# Patient Record
Sex: Male | Born: 1955
Health system: Southern US, Community
[De-identification: ages and names within clinical notes are randomized; demographics above are authoritative.]

## PROBLEM LIST (undated history)

## (undated) DIAGNOSIS — I2510 Atherosclerotic heart disease of native coronary artery without angina pectoris: Secondary | ICD-10-CM

## (undated) DIAGNOSIS — I10 Essential (primary) hypertension: Secondary | ICD-10-CM

## (undated) DIAGNOSIS — K219 Gastro-esophageal reflux disease without esophagitis: Secondary | ICD-10-CM

## (undated) DIAGNOSIS — E039 Hypothyroidism, unspecified: Secondary | ICD-10-CM

## (undated) HISTORY — PX: ABDOMINAL SURGERY: SHX537

## (undated) HISTORY — PX: FRACTURE SURGERY: SHX138

---

## 2002-10-01 ENCOUNTER — Encounter: Payer: Self-pay | Admitting: Family Medicine

## 2002-10-01 ENCOUNTER — Encounter: Admission: RE | Admit: 2002-10-01 | Discharge: 2002-10-01 | Payer: Self-pay | Admitting: Family Medicine

## 2015-05-11 ENCOUNTER — Other Ambulatory Visit: Payer: Self-pay

## 2015-05-11 ENCOUNTER — Ambulatory Visit (INDEPENDENT_AMBULATORY_CARE_PROVIDER_SITE_OTHER): Payer: BLUE CROSS/BLUE SHIELD | Admitting: Podiatry

## 2015-05-11 ENCOUNTER — Encounter: Payer: Self-pay | Admitting: Podiatry

## 2015-05-11 VITALS — BP 151/79 | HR 58 | Ht 66.0 in | Wt 173.0 lb

## 2015-05-11 DIAGNOSIS — M216X1 Other acquired deformities of right foot: Secondary | ICD-10-CM

## 2015-05-11 DIAGNOSIS — M7742 Metatarsalgia, left foot: Principal | ICD-10-CM | POA: Insufficient documentation

## 2015-05-11 DIAGNOSIS — M7740 Metatarsalgia, unspecified foot: Secondary | ICD-10-CM | POA: Diagnosis not present

## 2015-05-11 DIAGNOSIS — M21969 Unspecified acquired deformity of unspecified lower leg: Secondary | ICD-10-CM | POA: Diagnosis not present

## 2015-05-11 DIAGNOSIS — M216X9 Other acquired deformities of unspecified foot: Secondary | ICD-10-CM

## 2015-05-11 DIAGNOSIS — M7741 Metatarsalgia, right foot: Secondary | ICD-10-CM

## 2015-05-11 NOTE — Patient Instructions (Signed)
Seen for painful feet. Noted of tight Achilles tendon right. Need stretch exercise. Both feet casted for orthotics.

## 2015-05-11 NOTE — Progress Notes (Signed)
SUBJECTIVE: 60 y.o. year old male presents complaining of pain in both feet R>L.  Stated that he works at [employer] on feet 8-10 hours a day for the past 10 years.  Been wearing brand name shoes and insoles for a year. Feet were doing well till 2 months ago. Pain is mostly plantar lateral 1/2 of both feet R>L duration of 2 months.  REVIEW OF SYSTEMS: Hypertension and Thyroid problem that is controlled with medication.   OBJECTIVE: DERMATOLOGIC EXAMINATION: No abnormal skin lesions.  VASCULAR EXAMINATION OF LOWER LIMBS: Pedal pulses: All pedal pulses are palpable with normal pulsation.  Capillary Filling times within 3 seconds in all digits.  Temperature gradient from tibial crest to dorsum of foot is within normal bilateral.  NEUROLOGIC EXAMINATION OF THE LOWER LIMBS: All epicritic and tactile sensations grossly intact.   MUSCULOSKELETAL EXAMINATION: Positive for tight Achilles tendon on right. Positive for excess sagittal plane motion of the first ray bilateral.  Positive for forefoot varus with STJ placed in neutral position.   RADIOGRAPHIC STUDIES:  General overview: AP View:  Adducted metatarsal bones bilateral. Lateral view:  Supinated foot with elevated first ray bilateral. Positive of plantar calcaneal spur bilateral.  ASSESSMENT: 1. Lesser metatarsalgia bilateral R>L. 2. Ankle equinus right. 3. Forefoot varus bilateral. 4. Hypermobile first ray bilateral. 5. Compensatory STJ hyperpronation bilateral.  PLAN: Reviewed clinical findings and available treatment options. Reviewed proper shoe gear. Both feet casted for custom orthotics. Stretch exercise for tight Achilles tendon on right reviewed.

## 2019-07-28 ENCOUNTER — Inpatient Hospital Stay (HOSPITAL_BASED_OUTPATIENT_CLINIC_OR_DEPARTMENT_OTHER)
Admission: EM | Admit: 2019-07-28 | Discharge: 2019-07-30 | DRG: 247 | Disposition: A | Discharge status: Home / Self Care | Payer: BC Managed Care – PPO | Attending: Cardiology | Admitting: Cardiology

## 2019-07-28 ENCOUNTER — Emergency Department (HOSPITAL_BASED_OUTPATIENT_CLINIC_OR_DEPARTMENT_OTHER): Payer: BC Managed Care – PPO

## 2019-07-28 ENCOUNTER — Other Ambulatory Visit: Payer: Self-pay

## 2019-07-28 ENCOUNTER — Encounter (HOSPITAL_BASED_OUTPATIENT_CLINIC_OR_DEPARTMENT_OTHER): Payer: Self-pay | Admitting: Emergency Medicine

## 2019-07-28 DIAGNOSIS — M7741 Metatarsalgia, right foot: Secondary | ICD-10-CM | POA: Diagnosis present

## 2019-07-28 DIAGNOSIS — I214 Non-ST elevation (NSTEMI) myocardial infarction: Principal | ICD-10-CM | POA: Diagnosis present

## 2019-07-28 DIAGNOSIS — Z79890 Hormone replacement therapy (postmenopausal): Secondary | ICD-10-CM | POA: Diagnosis not present

## 2019-07-28 DIAGNOSIS — I10 Essential (primary) hypertension: Secondary | ICD-10-CM | POA: Diagnosis present

## 2019-07-28 DIAGNOSIS — M7742 Metatarsalgia, left foot: Secondary | ICD-10-CM | POA: Diagnosis present

## 2019-07-28 DIAGNOSIS — I200 Unstable angina: Secondary | ICD-10-CM

## 2019-07-28 DIAGNOSIS — E785 Hyperlipidemia, unspecified: Secondary | ICD-10-CM

## 2019-07-28 DIAGNOSIS — Z20822 Contact with and (suspected) exposure to covid-19: Secondary | ICD-10-CM | POA: Diagnosis present

## 2019-07-28 DIAGNOSIS — Z955 Presence of coronary angioplasty implant and graft: Secondary | ICD-10-CM

## 2019-07-28 DIAGNOSIS — N179 Acute kidney failure, unspecified: Secondary | ICD-10-CM | POA: Diagnosis not present

## 2019-07-28 DIAGNOSIS — Z79899 Other long term (current) drug therapy: Secondary | ICD-10-CM

## 2019-07-28 DIAGNOSIS — E039 Hypothyroidism, unspecified: Secondary | ICD-10-CM | POA: Diagnosis present

## 2019-07-28 DIAGNOSIS — K219 Gastro-esophageal reflux disease without esophagitis: Secondary | ICD-10-CM | POA: Diagnosis present

## 2019-07-28 DIAGNOSIS — I2510 Atherosclerotic heart disease of native coronary artery without angina pectoris: Secondary | ICD-10-CM

## 2019-07-28 HISTORY — DX: Gastro-esophageal reflux disease without esophagitis: K21.9

## 2019-07-28 HISTORY — DX: Hypothyroidism, unspecified: E03.9

## 2019-07-28 HISTORY — DX: Essential (primary) hypertension: I10

## 2019-07-28 HISTORY — DX: Atherosclerotic heart disease of native coronary artery without angina pectoris: I25.10

## 2019-07-28 LAB — BASIC METABOLIC PANEL
Anion gap: 13 (ref 5–15)
BUN: 26 mg/dL — ABNORMAL HIGH (ref 8–23)
CO2: 24 mmol/L (ref 22–32)
Calcium: 9 mg/dL (ref 8.9–10.3)
Chloride: 101 mmol/L (ref 98–111)
Creatinine, Ser: 1.67 mg/dL — ABNORMAL HIGH (ref 0.61–1.24)
GFR calc Af Amer: 50 mL/min — ABNORMAL LOW (ref >60)
GFR calc non Af Amer: 44 mL/min — ABNORMAL LOW (ref >60)
Glucose, Bld: 113 mg/dL — ABNORMAL HIGH (ref 70–99)
Potassium: 3.6 mmol/L (ref 3.5–5.1)
Sodium: 138 mmol/L (ref 135–145)

## 2019-07-28 LAB — CBC
HCT: 41.8 % (ref 39.0–52.0)
Hemoglobin: 14.7 g/dL (ref 13.0–17.0)
MCH: 30.2 pg (ref 26.0–34.0)
MCHC: 35.2 g/dL (ref 30.0–36.0)
MCV: 85.8 fL (ref 80.0–100.0)
Platelets: 263 10*3/uL (ref 150–400)
RBC: 4.87 MIL/uL (ref 4.22–5.81)
RDW: 13.3 % (ref 11.5–15.5)
WBC: 11 10*3/uL — ABNORMAL HIGH (ref 4.0–10.5)
nRBC: 0 % (ref 0.0–0.2)

## 2019-07-28 LAB — TROPONIN I (HIGH SENSITIVITY)
Troponin I (High Sensitivity): 59 ng/L — ABNORMAL HIGH (ref <18)
Troponin I (High Sensitivity): 131 ng/L (ref <18)
Troponin I (High Sensitivity): 449 ng/L (ref <18)
Troponin I (High Sensitivity): 859 ng/L (ref <18)

## 2019-07-28 LAB — SARS CORONAVIRUS 2 BY RT PCR (HOSPITAL ORDER, PERFORMED IN ~~LOC~~ HOSPITAL LAB): SARS Coronavirus 2: NEGATIVE

## 2019-07-28 LAB — HEPARIN LEVEL (UNFRACTIONATED): Heparin Unfractionated: 0.41 IU/mL (ref 0.30–0.70)

## 2019-07-28 MED ORDER — ASPIRIN EC 81 MG PO TBEC
81.0000 mg | DELAYED_RELEASE_TABLET | Freq: Every day | ORAL | Status: DC
  Administered 2019-07-30: 81 mg via ORAL
  Filled 2019-07-28: qty 1

## 2019-07-28 MED ORDER — LISINOPRIL 20 MG PO TABS
20.0000 mg | ORAL_TABLET | Freq: Every day | ORAL | Status: DC
  Administered 2019-07-29 – 2019-07-30 (×2): 20 mg via ORAL
  Filled 2019-07-28 (×2): qty 1

## 2019-07-28 MED ORDER — AMLODIPINE BESYLATE 10 MG PO TABS
10.0000 mg | ORAL_TABLET | Freq: Every day | ORAL | Status: DC
  Administered 2019-07-29 – 2019-07-30 (×2): 10 mg via ORAL
  Filled 2019-07-28 (×2): qty 1

## 2019-07-28 MED ORDER — HEPARIN BOLUS VIA INFUSION
4000.0000 [IU] | Freq: Once | INTRAVENOUS | Status: AC
  Administered 2019-07-28: 4000 [IU] via INTRAVENOUS

## 2019-07-28 MED ORDER — LISINOPRIL-HYDROCHLOROTHIAZIDE 20-12.5 MG PO TABS: 1.0000 | ORAL_TABLET | Freq: Every day | ORAL | Status: DC

## 2019-07-28 MED ORDER — NITROGLYCERIN 0.4 MG SL SUBL: 0.4000 mg | SUBLINGUAL_TABLET | SUBLINGUAL | Status: DC | PRN

## 2019-07-28 MED ORDER — HEPARIN (PORCINE) 25000 UT/250ML-% IV SOLN
900.0000 [IU]/h | INTRAVENOUS | Status: DC
  Administered 2019-07-28 – 2019-07-29 (×2): 900 [IU]/h via INTRAVENOUS
  Filled 2019-07-28 (×2): qty 250

## 2019-07-28 MED ORDER — SODIUM CHLORIDE 0.9 % IV BOLUS
1000.0000 mL | Freq: Once | INTRAVENOUS | Status: AC
  Administered 2019-07-28: 1000 mL via INTRAVENOUS

## 2019-07-28 MED ORDER — METOPROLOL SUCCINATE ER 25 MG PO TB24
25.0000 mg | ORAL_TABLET | Freq: Every day | ORAL | Status: DC
  Administered 2019-07-28 – 2019-07-30 (×3): 25 mg via ORAL
  Filled 2019-07-28 (×3): qty 1

## 2019-07-28 MED ORDER — PANTOPRAZOLE SODIUM 40 MG PO TBEC
40.0000 mg | DELAYED_RELEASE_TABLET | Freq: Every day | ORAL | Status: DC
  Administered 2019-07-29 – 2019-07-30 (×2): 40 mg via ORAL
  Filled 2019-07-28 (×2): qty 1

## 2019-07-28 MED ORDER — HYDROCHLOROTHIAZIDE 12.5 MG PO CAPS
12.5000 mg | ORAL_CAPSULE | Freq: Every day | ORAL | Status: DC
  Administered 2019-07-29 – 2019-07-30 (×2): 12.5 mg via ORAL
  Filled 2019-07-28 (×2): qty 1

## 2019-07-28 MED ORDER — ASPIRIN 81 MG PO CHEW
162.0000 mg | CHEWABLE_TABLET | Freq: Once | ORAL | Status: AC
  Administered 2019-07-28: 162 mg via ORAL
  Filled 2019-07-28: qty 2

## 2019-07-28 MED ORDER — ATORVASTATIN CALCIUM 80 MG PO TABS
80.0000 mg | ORAL_TABLET | Freq: Every day | ORAL | Status: DC
  Administered 2019-07-28 – 2019-07-29 (×2): 80 mg via ORAL
  Filled 2019-07-28 (×2): qty 1

## 2019-07-28 MED ORDER — ACETAMINOPHEN 325 MG PO TABS: 650.0000 mg | ORAL_TABLET | ORAL | Status: DC | PRN

## 2019-07-28 MED ORDER — LEVOTHYROXINE SODIUM 75 MCG PO TABS
75.0000 ug | ORAL_TABLET | Freq: Every day | ORAL | Status: DC
  Administered 2019-07-29 – 2019-07-30 (×2): 75 ug via ORAL
  Filled 2019-07-28 (×2): qty 1

## 2019-07-28 MED ORDER — ONDANSETRON HCL 4 MG/2ML IJ SOLN: 4.0000 mg | Freq: Four times a day (QID) | INTRAMUSCULAR | Status: DC | PRN

## 2019-07-28 NOTE — ED Notes (Signed)
ED Provider at bedside. 

## 2019-07-28 NOTE — ED Provider Notes (Signed)
[facility] EMERGENCY DEPARTMENT Provider Note   CSN: [id number] Arrival date & time: 07/28/19  1239     History Chief Complaint  Patient presents with  . Chest Pain    Christian Baird is a 64 y.o. male.  HPI   Pt is a 64 y/o male with h/o GERD, HTN, hypothyroidism who presents to the ED today for eval of chest pain that started yesterday. Pain located to the center of the chest. Pain feels like a dull ache. Pain rated 5-6/10 currently.  Pain has been intermittent. Pain sometimes associated with exertion but not exclusively. States he does a lot of heavy lifting at work and he did not notice sxs while he was working yesterday, but it did get worse when walking back from xray today while in the ED. He took two ASA prior to starting work this morning. Denies associated SOB, diaphoresis, nausea, vomiting. He has intermittent BLE edema that he says seems to be associated with his amlodipine. He has some coughing in the mornings. Denies pleuritic pain.  Denies leg pain, hemoptysis, recent surgery, recent long travel, hormone use, personal hx of cancer, or hx of DVT/PE. Denies tobacco use. Denies any known early family hx of heart disease.   Past Medical History:  Diagnosis Date  . GERD (gastroesophageal reflux disease)   . Hypertension   . Hypothyroidism     Patient Active Problem List   Diagnosis Date Noted  . NSTEMI (non-ST elevated myocardial infarction) (HCC) 07/28/2019  . Metatarsal deformity 05/11/2015  . Metatarsalgia of both feet 05/11/2015  . Equinus deformity of foot, acquired 05/11/2015    Past Surgical History:  Procedure Laterality Date  . ABDOMINAL SURGERY    . FRACTURE SURGERY         History reviewed. No pertinent family history.  Social History   Tobacco Use  . Smoking status: Never Smoker  . Smokeless tobacco: Never Used  Substance Use Topics  . Alcohol use: Not on file  . Drug use: Not on file    Home Medications Prior to Admission  medications   Medication Sig Start Date End Date Taking? Authorizing Provider  amLODipine (NORVASC) 10 MG tablet Take by mouth. 06/18/16 02/25/20 Yes [provider]  levothyroxine (SYNTHROID) 75 MCG tablet Take by mouth. 02/26/19 02/26/20 Yes [provider]  lisinopril-hydrochlorothiazide (ZESTORETIC) 20-12.5 MG tablet Take by mouth. 06/18/16 02/25/20 Yes [provider]  pantoprazole (PROTONIX) 40 MG tablet Take by mouth. 08/31/16 02/26/20 Yes [provider]  sildenafil (REVATIO) 20 MG tablet TAKE 3 TABLETS BY MOUTH DAILY AS NEEDED 06/26/16  Yes [provider]    Allergies    Patient has no known allergies.  Review of Systems   Review of Systems  Constitutional: Negative for chills and fever.  HENT: Negative for ear pain and sore throat.   Eyes: Negative for visual disturbance.  Respiratory: Negative for cough and shortness of breath.   Cardiovascular: Positive for chest pain and leg swelling. Negative for palpitations.  Gastrointestinal: Negative for abdominal pain, constipation, diarrhea, nausea and vomiting.  Genitourinary: Negative for dysuria and hematuria.  Musculoskeletal: Negative for back pain.  Skin: Negative for color change and rash.  Neurological: Negative for headaches.  All other systems reviewed and are negative.   Physical Exam Updated Vital Signs BP 140/80 (BP Location: Right Arm)   Pulse 66   Temp 97.9 F (36.6 C) (Oral)   Resp 14   Ht 5\' 6"  (1.676 m)   Wt 79.8 kg  SpO2 96%   BMI 28.41 kg/m   Physical Exam Vitals and nursing note reviewed.  Constitutional:      Appearance: He is well-developed.  HENT:     Head: Normocephalic and atraumatic.  Eyes:     Conjunctiva/sclera: Conjunctivae normal.  Cardiovascular:     Rate and Rhythm: Normal rate and regular rhythm.     Heart sounds: No murmur.  Pulmonary:     Effort: Pulmonary effort is normal. No respiratory distress.     Breath sounds: Normal breath sounds. No  decreased breath sounds, wheezing, rhonchi or rales.  Chest:     Chest wall: Tenderness (midsternal) present.  Abdominal:     General: Bowel sounds are normal.     Palpations: Abdomen is soft.     Tenderness: There is no abdominal tenderness.  Musculoskeletal:     Cervical back: Neck supple.     Right lower leg: No tenderness. Edema present.     Left lower leg: No tenderness. Edema present.  Skin:    General: Skin is warm and dry.  Neurological:     Mental Status: He is alert.     ED Results / Procedures / Treatments   Labs (all labs ordered are listed, but only abnormal results are displayed) Labs Reviewed  BASIC METABOLIC PANEL - Abnormal; Notable for the following components:      Result Value   Glucose, Bld 113 (*)    BUN 26 (*)    Creatinine, Ser 1.67 (*)    GFR calc non Af Amer 44 (*)    GFR calc Af Amer 50 (*)    All other components within normal limits  CBC - Abnormal; Notable for the following components:   WBC 11.0 (*)    All other components within normal limits  TROPONIN I (HIGH SENSITIVITY) - Abnormal; Notable for the following components:   Troponin I (High Sensitivity) 59 (*)    All other components within normal limits  TROPONIN I (HIGH SENSITIVITY) - Abnormal; Notable for the following components:   Troponin I (High Sensitivity) 131 (*)    All other components within normal limits  TROPONIN I (HIGH SENSITIVITY) - Abnormal; Notable for the following components:   Troponin I (High Sensitivity) 449 (*)    All other components within normal limits  TROPONIN I (HIGH SENSITIVITY) - Abnormal; Notable for the following components:   Troponin I (High Sensitivity) 859 (*)    All other components within normal limits  SARS CORONAVIRUS 2 BY RT PCR (HOSPITAL ORDER, [facility])  HEPARIN LEVEL (UNFRACTIONATED)  HEPARIN LEVEL (UNFRACTIONATED)  CBC    EKG EKG Interpretation  Date/Time:  Tuesday July 28 2019 12:47:59 EDT Ventricular  Rate:  78 PR Interval:    QRS Duration: 97 QT Interval:  390 QTC Calculation: 445 R Axis:   84 Text Interpretation: Sinus rhythm Consider left atrial enlargement Borderline right axis deviation Borderline ST depression, diffuse leads No old ecg for comparison Confirmed by Veryl Speak [phone]) on 07/28/2019 2:07:33 PM    Radiology DG Chest 2 View  Result Date: 07/28/2019 CLINICAL DATA:  Chest pain EXAM: CHEST - 2 VIEW COMPARISON:  None. FINDINGS: The heart size and mediastinal contours are within normal limits. Both lungs are clear. The visualized skeletal structures are unremarkable. IMPRESSION: No active cardiopulmonary disease. Electronically Signed   By: Kathreen Devoid   On: 07/28/2019 13:44    Procedures Procedures (including critical care time)  CRITICAL CARE Performed by: Willia Craze  Naesha Buckalew   Total critical care time: 43 minutes  Critical care time was exclusive of separately billable procedures and treating other patients.  Critical care was necessary to treat or prevent imminent or life-threatening deterioration.  Critical care was time spent personally by me on the following activities: development of treatment plan with patient and/or surrogate as well as nursing, discussions with consultants, evaluation of patient's response to treatment, examination of patient, obtaining history from patient or surrogate, ordering and performing treatments and interventions, ordering and review of laboratory studies, ordering and review of radiographic studies, pulse oximetry and re-evaluation of patient's condition.   Medications Ordered in ED Medications  heparin ADULT infusion 100 units/mL (25000 units/249mL sodium chloride 0.45%) (900 Units/hr Intravenous New Bag/Given 07/28/19 1403)  aspirin chewable tablet 162 mg (162 mg Oral Given 07/28/19 1349)  sodium chloride 0.9 % bolus 1,000 mL ( Intravenous Stopped 07/28/19 1618)  heparin bolus via infusion 4,000 Units (4,000 Units Intravenous Bolus  from Bag 07/28/19 1404)    ED Course  I have reviewed the triage vital signs and the nursing notes.  Pertinent labs & imaging results that were available during my care of the patient were reviewed by me and considered in my medical decision making (see chart for details).    MDM Rules/Calculators/A&P                      64 y/o M presenting for 1 day h/o intermittent chest pain located midsternally.   CBC with mild leuokocytosis, no anemia BMP with elevated BUN/Cr, otherwise reassuring. Initial trop elevated at 59  EKG with Sinus rhythm Consider left atrial enlargement Borderline right axis deviation Borderline ST depression, diffuse leads No old ecg for comparison   CXR reviewed/interpreted - no acute cardiopulmonary disease  1:49 PM CONSULT with Dr. Tenny Craw with cardiology who accepts patient for admission. Recommends heparin and ntg drip.  Upon chart review, pt is on sildenafil. Will hold on ntg at this time but will start heparin. Dr. Judd Lien, supervising physician, is in agreement.   4:19 PM On recheck. Pt states his sxs have completely resolved after heparin drip. His BP is also now in the 140s.  He states he has not taken sildenafil in 6 days. Will continue to monitor.   6:04 PM Pt continues to be chest pain free. Trop is increasing to 400. Will repeat EKG  Repeat EKG without any dynamic changes.   Pt continues to be chest pain free. He was transferred to [facility] in stable condition.  Final Clinical Impression(s) / ED Diagnoses Final diagnoses:  None    Rx / DC Orders ED Discharge Orders    None       Rayne Du 07/28/19 2114    Geoffery Lyons, MD 07/29/19 [phone]

## 2019-07-28 NOTE — Progress Notes (Signed)
ANTICOAGULATION CONSULT NOTE  Pharmacy Consult for heparin Indication: chest pain/ACS  No Known Allergies  Patient Measurements: Height: 5\' 6"  (167.6 cm) Weight: 79.8 kg (176 lb) IBW/kg (Calculated) : 63.8 Heparin Dosing Weight: 77 kg  Vital Signs: Temp: 97.9 F (36.6 C) (06/08 2003) Temp Source: Oral (06/08 2003) BP: 140/80 (06/08 2003) Pulse Rate: 66 (06/08 2003)  Labs: Recent Labs    07/28/19 1253 07/28/19 1253 07/28/19 1455 07/28/19 1708 07/28/19 1912 07/28/19 2130  HGB 14.7  --   --   --   --   --   HCT 41.8  --   --   --   --   --   PLT 263  --   --   --   --   --   HEPARINUNFRC  --   --   --   --   --  0.41  CREATININE 1.67*  --   --   --   --   --   TROPONINIHS 59*   < > 131* 449* 859*  --    < > = values in this interval not displayed.    Estimated Creatinine Clearance: 46.1 mL/min (A) (by C-G formula based on SCr of 1.67 mg/dL (H)).   Medical History: Past Medical History:  Diagnosis Date  . GERD (gastroesophageal reflux disease)   . Hypertension   . Hypothyroidism     Medications:  Medications Prior to Admission  Medication Sig Dispense Refill Last Dose  . amLODipine (NORVASC) 10 MG tablet Take 10 mg by mouth daily.    07/28/2019 at Unknown time  . levothyroxine (SYNTHROID) 75 MCG tablet Take 75 mcg by mouth daily.    07/28/2019 at Unknown time  . lisinopril-hydrochlorothiazide (ZESTORETIC) 20-12.5 MG tablet Take 1 tablet by mouth daily.    07/28/2019 at Unknown time  . pantoprazole (PROTONIX) 40 MG tablet Take 40 mg by mouth daily.    07/28/2019 at Unknown time  . sildenafil (REVATIO) 20 MG tablet Take 20 mg by mouth daily as needed (For erectile dysfunction).    unknown at unknown    Assessment: 29 yom presenting with NSTEMI. Pharmacy consulted to dose heparin. Initial heparin level therapeutic. Patient is not on anticoagulation PTA. CBC wnl. No active bleed issues reported. Cardiology planning cath.   Goal of Therapy:  Heparin level 0.3-0.7  units/ml Monitor platelets by anticoagulation protocol: Yes   Plan:  Continue heparin infusion at 900 units/hr Check confirmatory heparin level with AM labs Monitor daily heparin level and CBC, s/sx bleeding Cardiology planning cath 6/9   8/9, PharmD, BCPS Please check AMION for all [facility] contact numbers Clinical Pharmacist 07/28/2019 10:27 PM

## 2019-07-28 NOTE — Progress Notes (Signed)
ANTICOAGULATION CONSULT NOTE - Initial Consult  Pharmacy Consult for heparin Indication: chest pain/ACS  No Known Allergies  Patient Measurements: Height: 5\' 6"  (167.6 cm) Weight: 77.1 kg (170 lb) IBW/kg (Calculated) : 63.8 Heparin Dosing Weight: 77  Vital Signs: Temp: 98.3 F (36.8 C) (06/08 1250) Temp Source: Oral (06/08 1250) BP: 146/80 (06/08 1330) Pulse Rate: 65 (06/08 1330)  Labs: Recent Labs    07/28/19 1253  HGB 14.7  HCT 41.8  PLT 263  CREATININE 1.67*  TROPONINIHS 59*    Estimated Creatinine Clearance: 45.4 mL/min (A) (by C-G formula based on SCr of 1.67 mg/dL (H)).   Medical History: Past Medical History:  Diagnosis Date  . GERD (gastroesophageal reflux disease)   . Hypertension   . Hypothyroidism     Medications:  (Not in a hospital admission)   Assessment: new onset CP, with shortness of breath and positive troponin.   Goal of Therapy:  Heparin level 0.3-0.7 units/ml Monitor platelets by anticoagulation protocol: Yes   Plan:  Give 4000 units bolus x 1 Start heparin infusion at 900 units/hr Check anti-Xa level in 6 hours and daily while on heparin Continue to monitor H&H and platelets  09/27/19 A Graciela Plato 07/28/2019,1:56 PM

## 2019-07-28 NOTE — ED Notes (Signed)
Attempted additional IV access x 2; unable to obtain.

## 2019-07-28 NOTE — H&P (Signed)
Cardiology Admission History and Physical:   Patient ID: Christian Baird MRN: [id number]; DOB: [birth year]   Admission date: 07/28/2019  Primary Care Provider: Wilburn Mylar, MD Primary Cardiologist: No primary care provider on file.  Primary Electrophysiologist:  None   Chief Complaint:  Chest pain  Patient Profile:   Christian Baird is a 64 y.o. male with HTN, hypothyroid, GERD presents with chest pain.   History of Present Illness:   [name] reports developing sudden onset chest pain yesterday while at work. [place] He has never had chest pain before. He was at work and suddenly felt a painful burning sensation in the center of his chest. He had some SOB with this as well. The pain was non-radiating. This symptom waxed and waned throughout the remainder of the day. He states that his symptoms returned today, thus he became concerned and he came to the ED for workup.   The patient has no history of smoking and no history of CV disease. He uses no drugs or alcohol.   On arrival to the ED, the patient's vital signs were significant for only mild hypertension. An ECG revealed ST depression and his initial troponin was elevated to 131. He was given aspirin and started on a heparin drip. He was transferred to [facility] for treatment.   On arrival to [facility], the patient is chest pain free and has no complaints. He states he has been chest pain free for about 6 hours now, ever since shortly after the heparin drip was started.   Heart Pathway Score:     Past Medical History:  Diagnosis Date  . GERD (gastroesophageal reflux disease)   . Hypertension   . Hypothyroidism     Past Surgical History:  Procedure Laterality Date  . ABDOMINAL SURGERY    . FRACTURE SURGERY       Medications Prior to Admission: Prior to Admission medications   Medication Sig Start Date End Date Taking? Authorizing Provider  amLODipine (NORVASC)  10 MG tablet Take by mouth. 06/18/16 02/25/20 Yes [provider]  levothyroxine (SYNTHROID) 75 MCG tablet Take by mouth. 02/26/19 02/26/20 Yes [provider]  lisinopril-hydrochlorothiazide (ZESTORETIC) 20-12.5 MG tablet Take by mouth. 06/18/16 02/25/20 Yes [provider]  pantoprazole (PROTONIX) 40 MG tablet Take by mouth. 08/31/16 02/26/20 Yes [provider]  sildenafil (REVATIO) 20 MG tablet TAKE 3 TABLETS BY MOUTH DAILY AS NEEDED 06/26/16  Yes [provider]     Allergies:   No Known Allergies  Social History:   Social History   Socioeconomic History  . Marital status: Single    Spouse name: Not on file  . Number of children: Not on file  . Years of education: Not on file  . Highest education level: Not on file  Occupational History  . Not on file  Tobacco Use  . Smoking status: Never Smoker  . Smokeless tobacco: Never Used  Substance and Sexual Activity  . Alcohol use: Not on file  . Drug use: Not on file  . Sexual activity: Not on file  Other Topics Concern  . Not on file  Social History Narrative  . Not on file   Social Determinants of Health   Financial Resource Strain:   . Difficulty of Paying Living Expenses:   Food Insecurity:   . Worried About Programme researcher, broadcasting/film/video in the Last Year:   . [employer] of Food in the Last  Year:   Transportation Needs:   . Freight forwarder (Medical):   Marland Kitchen Lack of Transportation (Non-Medical):   Physical Activity:   . Days of Exercise per Week:   . Minutes of Exercise per Session:   Stress:   . Feeling of Stress :   Social Connections:   . Frequency of Communication with Friends and Family:   . Frequency of Social Gatherings with Friends and Family:   . Attends Religious Services:   . Active Member of Clubs or Organizations:   . Attends Banker Meetings:   Marland Kitchen Marital Status:   Intimate Partner Violence:   . Fear of Current or Ex-Partner:   . Emotionally Abused:   Marland Kitchen Physically  Abused:   . Sexually Abused:     Family History:   The patient's family history is not on file.    ROS:  Please see the history of present illness.  All other ROS reviewed and negative.     Physical Exam/Data:   Vitals:   07/28/19 1830 07/28/19 1905 07/28/19 1930 07/28/19 2003  BP: (!) 131/109 (!) 142/86 131/81 140/80  Pulse: 66 69 64 66  Resp: (!) 22 18 (!) 21 14  Temp:  98.4 F (36.9 C)  97.9 F (36.6 C)  TempSrc:  Oral  Oral  SpO2: 95% 99% 96% 96%  Weight:      Height:        Intake/Output Summary (Last 24 hours) at 07/28/2019 2101 Last data filed at 07/28/2019 2007 Gross per 24 hour  Intake 1003.84 ml  Output 2540 ml  Net -1536.16 ml   Last 3 Weights 07/28/2019 05/11/2015  Weight (lbs) 170 lb 173 lb  Weight (kg) 77.111 kg 78.472 kg     Body mass index is 27.44 kg/m.  General:  Well nourished, well developed, in no acute distress HEENT: normal Lymph: no adenopathy Neck: no JVD Endocrine:  No thryomegaly Vascular: No carotid bruits; FA pulses 2+ bilaterally without bruits  Cardiac:  normal S1, S2; RRR; no murmur Lungs:  clear to auscultation bilaterally, no wheezing, rhonchi or rales  Abd: soft, nontender, no hepatomegaly  Ext: trace bilateral ankle edema Musculoskeletal:  No deformities, BUE and BLE strength normal and equal Skin: warm and dry  Neuro:  CNs 2-12 intact, no focal abnormalities noted Psych:  Normal affect    EKG:  The ECG that was done on arrival to the ED was personally reviewed and demonstrates ST depression in the lateral/apical precordium. This ST depression had resolved on a subsequent ECG.   Relevant CV Studies: None  Laboratory Data:  High Sensitivity Troponin:   Recent Labs  Lab 07/28/19 1253 07/28/19 1455 07/28/19 1708 07/28/19 1912  TROPONINIHS 59* 131* 449* 859*      Chemistry Recent Labs  Lab 07/28/19 1253  NA 138  K 3.6  CL 101  CO2 24  GLUCOSE 113*  BUN 26*  CREATININE 1.67*  CALCIUM 9.0  GFRNONAA 44*  GFRAA  50*  ANIONGAP 13    No results for input(s): PROT, ALBUMIN, AST, ALT, ALKPHOS, BILITOT in the last 168 hours. Hematology Recent Labs  Lab 07/28/19 1253  WBC 11.0*  RBC 4.87  HGB 14.7  HCT 41.8  MCV 85.8  MCH 30.2  MCHC 35.2  RDW 13.3  PLT 263   BNPNo results for input(s): BNP, PROBNP in the last 168 hours.  DDimer No results for input(s): DDIMER in the last 168 hours.   Radiology/Studies:  DG Chest 2 View  Result Date: 07/28/2019 CLINICAL DATA:  Chest pain EXAM: CHEST - 2 VIEW COMPARISON:  None. FINDINGS: The heart size and mediastinal contours are within normal limits. Both lungs are clear. The visualized skeletal structures are unremarkable. IMPRESSION: No active cardiopulmonary disease. Electronically Signed   By: Kathreen Devoid   On: 07/28/2019 13:44         Assessment and Plan:  Christian Baird is a 64 y.o. male with HTN, hypothyroid, GERD presents with chest pain. Given elevated troponin and ST depression on ECG, symptoms consistent with NSTE-ACS. Symptoms now resolved on heparin drip, thus unlikely attributable to acute aortic syndrome or PE.   #) NSTE-ACS Diagnostics: - echo in AM - check lipids, A1c  Therapeutics: - NPO for cath in AM - ASA 324mg  then 81mg  daily - heparin drip for ACS per pharmacy protocol - atorvastatin 80mg  QHS - start metoprolol 25mg  daily - cont home lisinopril HCTZ - SLN, nitro gtt PRN - defer P2Y12 until after cath - cardiac rehab  #) HTN - cont home amlodipine, lisinopril/HCTZ  #) Hypothyroid - cont home synthroid  Severity of Illness: The appropriate patient status for this patient is INPATIENT. Inpatient status is judged to be reasonable and necessary in order to provide the required intensity of service to ensure the patient's safety. The patient's presenting symptoms, physical exam findings, and initial radiographic and laboratory data in the context of their chronic comorbidities is felt to place them at high risk for further  clinical deterioration. Furthermore, it is not anticipated that the patient will be medically stable for discharge from the hospital within 2 midnights of admission. The following factors support the patient status of inpatient.   " The patient's presenting symptoms include chest pain. " The worrisome physical exam findings include N/A. " The initial radiographic and laboratory data are worrisome because of elevated troponin, ST depression. " The chronic co-morbidities include HTN.   * I certify that at the point of admission it is my clinical judgment that the patient will require inpatient hospital care spanning beyond 2 midnights from the point of admission due to high intensity of service, high risk for further deterioration and high frequency of surveillance required.*    For questions or updates, please contact [facility] Please consult [email or web address] for contact info under        Signed, Marcie Mowers, MD  07/28/2019 9:01 PM

## 2019-07-28 NOTE — ED Notes (Signed)
Attempted to call report; contact info for this RN provided for callback. 

## 2019-07-28 NOTE — ED Triage Notes (Signed)
Intermittent chest pain, since yesterday central chest, no diaphoresis, a little sob, No N/V, some weakness.  Pt states he is having some pain now but less than other events.  Pt works a stressful job.

## 2019-07-29 ENCOUNTER — Encounter (HOSPITAL_COMMUNITY)
Admission: EM | Disposition: A | Discharge status: Home / Self Care | Payer: Self-pay | Source: Home / Self Care | Attending: Cardiology

## 2019-07-29 ENCOUNTER — Inpatient Hospital Stay (HOSPITAL_COMMUNITY): Payer: BC Managed Care – PPO

## 2019-07-29 DIAGNOSIS — I214 Non-ST elevation (NSTEMI) myocardial infarction: Secondary | ICD-10-CM

## 2019-07-29 DIAGNOSIS — I2510 Atherosclerotic heart disease of native coronary artery without angina pectoris: Secondary | ICD-10-CM

## 2019-07-29 HISTORY — PX: LEFT HEART CATH AND CORONARY ANGIOGRAPHY: CATH118249

## 2019-07-29 HISTORY — PX: CORONARY STENT INTERVENTION: CATH118234

## 2019-07-29 LAB — CBC
HCT: 41.9 % (ref 39.0–52.0)
Hemoglobin: 14.2 g/dL (ref 13.0–17.0)
MCH: 29.4 pg (ref 26.0–34.0)
MCHC: 33.9 g/dL (ref 30.0–36.0)
MCV: 86.7 fL (ref 80.0–100.0)
Platelets: 248 10*3/uL (ref 150–400)
RBC: 4.83 MIL/uL (ref 4.22–5.81)
RDW: 13.2 % (ref 11.5–15.5)
WBC: 9.5 10*3/uL (ref 4.0–10.5)
nRBC: 0 % (ref 0.0–0.2)

## 2019-07-29 LAB — HEPARIN LEVEL (UNFRACTIONATED): Heparin Unfractionated: 0.41 IU/mL (ref 0.30–0.70)

## 2019-07-29 LAB — BASIC METABOLIC PANEL
Anion gap: 10 (ref 5–15)
BUN: 17 mg/dL (ref 8–23)
CO2: 24 mmol/L (ref 22–32)
Calcium: 8.5 mg/dL — ABNORMAL LOW (ref 8.9–10.3)
Chloride: 107 mmol/L (ref 98–111)
Creatinine, Ser: 1.23 mg/dL (ref 0.61–1.24)
GFR calc Af Amer: >60 mL/min (ref >60)
GFR calc non Af Amer: >60 mL/min (ref >60)
Glucose, Bld: 96 mg/dL (ref 70–99)
Potassium: 3.6 mmol/L (ref 3.5–5.1)
Sodium: 141 mmol/L (ref 135–145)

## 2019-07-29 LAB — HEMOGLOBIN A1C
Hgb A1c MFr Bld: 5.7 % — ABNORMAL HIGH (ref 4.8–5.6)
Mean Plasma Glucose: 116.89 mg/dL

## 2019-07-29 LAB — ECHOCARDIOGRAM COMPLETE
Height: 66 in
Weight: 2804.25 oz

## 2019-07-29 LAB — TSH: TSH: 2.618 u[IU]/mL (ref 0.350–4.500)

## 2019-07-29 LAB — LIPID PANEL
Cholesterol: 141 mg/dL (ref 0–200)
HDL: 32 mg/dL — ABNORMAL LOW (ref >40)
LDL Cholesterol: 97 mg/dL (ref 0–99)
Total CHOL/HDL Ratio: 4.4 RATIO
Triglycerides: 61 mg/dL (ref <150)
VLDL: 12 mg/dL (ref 0–40)

## 2019-07-29 LAB — POCT ACTIVATED CLOTTING TIME: Activated Clotting Time: 368 seconds

## 2019-07-29 LAB — HIV ANTIBODY (ROUTINE TESTING W REFLEX): HIV Screen 4th Generation wRfx: NONREACTIVE

## 2019-07-29 SURGERY — LEFT HEART CATH AND CORONARY ANGIOGRAPHY
Anesthesia: LOCAL

## 2019-07-29 MED ORDER — MIDAZOLAM HCL 2 MG/2ML IJ SOLN
INTRAMUSCULAR | Status: AC
  Filled 2019-07-29: qty 2

## 2019-07-29 MED ORDER — SODIUM CHLORIDE 0.9% FLUSH
3.0000 mL | INTRAVENOUS | Status: DC | PRN
  Administered 2019-07-29: 3 mL via INTRAVENOUS

## 2019-07-29 MED ORDER — HEPARIN SODIUM (PORCINE) 1000 UNIT/ML IJ SOLN
INTRAMUSCULAR | Status: AC
  Filled 2019-07-29: qty 1

## 2019-07-29 MED ORDER — ASPIRIN 81 MG PO CHEW
81.0000 mg | CHEWABLE_TABLET | ORAL | Status: AC
  Administered 2019-07-29: 81 mg via ORAL
  Filled 2019-07-29: qty 1

## 2019-07-29 MED ORDER — TICAGRELOR 90 MG PO TABS
ORAL_TABLET | ORAL | Status: DC | PRN
  Administered 2019-07-29: 180 mg via ORAL

## 2019-07-29 MED ORDER — HEPARIN (PORCINE) IN NACL 1000-0.9 UT/500ML-% IV SOLN
INTRAVENOUS | Status: DC | PRN
  Administered 2019-07-29 (×2): 500 mL

## 2019-07-29 MED ORDER — FENTANYL CITRATE (PF) 100 MCG/2ML IJ SOLN
INTRAMUSCULAR | Status: DC | PRN
  Administered 2019-07-29: 25 ug via INTRAVENOUS

## 2019-07-29 MED ORDER — TICAGRELOR 90 MG PO TABS
ORAL_TABLET | ORAL | Status: AC
  Filled 2019-07-29: qty 1

## 2019-07-29 MED ORDER — HEPARIN (PORCINE) IN NACL 1000-0.9 UT/500ML-% IV SOLN
INTRAVENOUS | Status: AC
  Filled 2019-07-29: qty 1000

## 2019-07-29 MED ORDER — HEPARIN SODIUM (PORCINE) 1000 UNIT/ML IJ SOLN
INTRAMUSCULAR | Status: DC | PRN
  Administered 2019-07-29 (×2): 4000 [IU] via INTRAVENOUS

## 2019-07-29 MED ORDER — SODIUM CHLORIDE 0.9 % IV SOLN: 250.0000 mL | INTRAVENOUS | Status: DC | PRN

## 2019-07-29 MED ORDER — VERAPAMIL HCL 2.5 MG/ML IV SOLN
INTRAVENOUS | Status: DC | PRN
  Administered 2019-07-29: 10 mL via INTRA_ARTERIAL

## 2019-07-29 MED ORDER — LIDOCAINE HCL (PF) 1 % IJ SOLN
INTRAMUSCULAR | Status: DC | PRN
  Administered 2019-07-29: 2 mL

## 2019-07-29 MED ORDER — SODIUM CHLORIDE 0.9 % WEIGHT BASED INFUSION
3.0000 mL/kg/h | INTRAVENOUS | Status: DC
  Administered 2019-07-29: 3 mL/kg/h via INTRAVENOUS

## 2019-07-29 MED ORDER — MIDAZOLAM HCL 2 MG/2ML IJ SOLN
INTRAMUSCULAR | Status: DC | PRN
  Administered 2019-07-29: 1 mg via INTRAVENOUS

## 2019-07-29 MED ORDER — IOHEXOL 350 MG/ML SOLN
INTRAVENOUS | Status: DC | PRN
  Administered 2019-07-29: 105 mL

## 2019-07-29 MED ORDER — TICAGRELOR 90 MG PO TABS
90.0000 mg | ORAL_TABLET | Freq: Two times a day (BID) | ORAL | Status: DC
  Administered 2019-07-30: 90 mg via ORAL
  Filled 2019-07-29: qty 1

## 2019-07-29 MED ORDER — SODIUM CHLORIDE 0.9% FLUSH
3.0000 mL | Freq: Two times a day (BID) | INTRAVENOUS | Status: DC
  Administered 2019-07-30: 3 mL via INTRAVENOUS

## 2019-07-29 MED ORDER — FENTANYL CITRATE (PF) 100 MCG/2ML IJ SOLN
INTRAMUSCULAR | Status: AC
  Filled 2019-07-29: qty 2

## 2019-07-29 MED ORDER — SODIUM CHLORIDE 0.9 % WEIGHT BASED INFUSION: 1.0000 mL/kg/h | INTRAVENOUS | Status: AC

## 2019-07-29 MED ORDER — ENOXAPARIN SODIUM 40 MG/0.4ML ~~LOC~~ SOLN: 40.0000 mg | SUBCUTANEOUS | Status: DC

## 2019-07-29 MED ORDER — SODIUM CHLORIDE 0.9 % WEIGHT BASED INFUSION: 1.0000 mL/kg/h | INTRAVENOUS | Status: DC

## 2019-07-29 MED ORDER — SODIUM CHLORIDE 0.9% FLUSH: 3.0000 mL | INTRAVENOUS | Status: DC | PRN

## 2019-07-29 MED ORDER — VERAPAMIL HCL 2.5 MG/ML IV SOLN
INTRAVENOUS | Status: AC
  Filled 2019-07-29: qty 2

## 2019-07-29 MED ORDER — SODIUM CHLORIDE 0.9% FLUSH: 3.0000 mL | Freq: Two times a day (BID) | INTRAVENOUS | Status: DC

## 2019-07-29 MED ORDER — LIDOCAINE HCL (PF) 1 % IJ SOLN
INTRAMUSCULAR | Status: AC
  Filled 2019-07-29: qty 30

## 2019-07-29 SURGICAL SUPPLY — 16 items
BALLN SAPPHIRE 2.0X12 (BALLOONS) ×4
BALLN SAPPHIRE ~~LOC~~ 3.0X12 (BALLOONS) ×1 IMPLANT
BALLOON SAPPHIRE 2.0X12 (BALLOONS) IMPLANT
CATH 5FR JL3.5 JR4 ANG PIG MP (CATHETERS) ×1 IMPLANT
CATH VISTA GUIDE 6FR JR4 (CATHETERS) ×1 IMPLANT
DEVICE RAD COMP TR BAND LRG (VASCULAR PRODUCTS) ×1 IMPLANT
GLIDESHEATH SLEND SS 6F .021 (SHEATH) ×1 IMPLANT
GUIDEWIRE INQWIRE 1.5J.035X260 (WIRE) IMPLANT
INQWIRE 1.5J .035X260CM (WIRE) ×2
KIT ENCORE 26 ADVANTAGE (KITS) ×1 IMPLANT
KIT HEART LEFT (KITS) ×2 IMPLANT
PACK CARDIAC CATHETERIZATION (CUSTOM PROCEDURE TRAY) ×2 IMPLANT
STENT RESOLUTE ONYX 2.75X15 (Permanent Stent) ×1 IMPLANT
TRANSDUCER W/STOPCOCK (MISCELLANEOUS) ×2 IMPLANT
TUBING CIL FLEX 10 FLL-RA (TUBING) ×2 IMPLANT
WIRE ASAHI PROWATER 180CM (WIRE) ×1 IMPLANT

## 2019-07-29 NOTE — Progress Notes (Addendum)
 Progress Note  Patient Name: Christian Baird Date of Encounter: 07/29/2019  [facility] Cardiologist: New  Subjective   Plan for cardiac cath today. Patient is chest pain free.   Inpatient Medications    Scheduled Meds: . amLODipine  10 mg Oral Daily  . aspirin EC  81 mg Oral Daily  . atorvastatin  80 mg Oral Daily  . lisinopril  20 mg Oral Daily   And  . hydrochlorothiazide  12.5 mg Oral Daily  . levothyroxine  75 mcg Oral Q0600  . metoprolol succinate  25 mg Oral Daily  . pantoprazole  40 mg Oral Daily   Continuous Infusions: . heparin 900 Units/hr (07/28/19 1403)   PRN Meds: acetaminophen, nitroGLYCERIN, ondansetron (ZOFRAN) IV   Vital Signs    Vitals:   07/28/19 1930 07/28/19 2003 07/28/19 2235 07/29/19 0421  BP: 131/81 140/80 139/80 127/76  Pulse: 64 66 61 (!) 58  Resp: (!) 21 14  19  Temp:  97.9 F (36.6 C)  98.5 F (36.9 C)  TempSrc:  Oral  Oral  SpO2: 96% 96%  98%  Weight:  79.8 kg  79.5 kg  Height:        Intake/Output Summary (Last 24 hours) at 07/29/2019 0654 Last data filed at 07/29/2019 0300 Gross per 24 hour  Intake 1158.4 ml  Output 2540 ml  Net -1381.6 ml   Last 3 Weights 07/29/2019 07/28/2019 07/28/2019  Weight (lbs) 175 lb 4.3 oz 176 lb 170 lb  Weight (kg) 79.5 kg 79.833 kg 77.111 kg      Telemetry    Sinus, HR 60s - Personally Reviewed  ECG    Sinus brady, TWI III - Personally Reviewed  Physical Exam   GEN: No acute distress.   Neck: No JVD Cardiac: RRR, no murmurs, rubs, or gallops.  Respiratory: Clear to auscultation bilaterally. GI: Soft, nontender, non-distended  MS: No edema; No deformity. Neuro:  Nonfocal  Psych: Normal affect   Labs    High Sensitivity Troponin:   Recent Labs  Lab 07/28/19 1253 07/28/19 1455 07/28/19 1708 07/28/19 1912  TROPONINIHS 59* 131* 449* 859*      Chemistry Recent Labs  Lab 07/28/19 1253 07/29/19 0512  NA 138 141  K 3.6 3.6  CL 101 107  CO2 24 24  GLUCOSE 113* 96  BUN 26*  17  CREATININE 1.67* 1.23  CALCIUM 9.0 8.5*  GFRNONAA 44* >60  GFRAA 50* >60  ANIONGAP 13 10     Hematology Recent Labs  Lab 07/28/19 1253 07/29/19 0512  WBC 11.0* 9.5  RBC 4.87 4.83  HGB 14.7 14.2  HCT 41.8 41.9  MCV 85.8 86.7  MCH 30.2 29.4  MCHC 35.2 33.9  RDW 13.3 13.2  PLT 263 248    BNPNo results for input(s): BNP, PROBNP in the last 168 hours.   DDimer No results for input(s): DDIMER in the last 168 hours.   Radiology    DG Chest 2 View  Result Date: 07/28/2019 CLINICAL DATA:  Chest pain EXAM: CHEST - 2 VIEW COMPARISON:  None. FINDINGS: The heart size and mediastinal contours are within normal limits. Both lungs are clear. The visualized skeletal structures are unremarkable. IMPRESSION: No active cardiopulmonary disease. Electronically Signed   By: Hetal  Patel   On: 07/28/2019 13:44    Cardiac Studies   Echo ordered Cardiac cath  Patient Profile     64 y.o. male with HTN, hypothyroid, GERD presents with chest pain.   Assessment & Plan      NSTEMI - HS troponin [id number] - EKG NSR with borderline ST depression inferolateral leads - started on Aspirin, metoprolol 25mg  daily, atorvastatin - Home lisinopril-HCTZ continued on admission - IV heparin - Echo ordered - LDL 97, HDL 32, TG 61 - A1C 5.7 - Plan for cardiac cath today Risks and benefits of cardiac catheterization have been discussed with the patient.  These include bleeding, infection, kidney damage, stroke, heart attack, death.  The patient understands these risks and is willing to proceed.  HTN - Home amlodipine and lisinopril-HCTZ - BB as above  Hypothyroid - synthroid - TSH ordered  For questions or updates, please contact [facility] Please consult [email or web address] for contact info under        Signed, Halayna Blane , PA-C  07/29/2019, 6:54 AM

## 2019-07-29 NOTE — H&P (View-Only) (Signed)
Progress Note  Patient Name: Christian Baird Date of Encounter: 07/29/2019  [facility] Cardiologist: New  Subjective   Plan for cardiac cath today. Patient is chest pain free.   Inpatient Medications    Scheduled Meds: . amLODipine  10 mg Oral Daily  . aspirin EC  81 mg Oral Daily  . atorvastatin  80 mg Oral Daily  . lisinopril  20 mg Oral Daily   And  . hydrochlorothiazide  12.5 mg Oral Daily  . levothyroxine  75 mcg Oral Q0600  . metoprolol succinate  25 mg Oral Daily  . pantoprazole  40 mg Oral Daily   Continuous Infusions: . heparin 900 Units/hr (07/28/19 1403)   PRN Meds: acetaminophen, nitroGLYCERIN, ondansetron (ZOFRAN) IV   Vital Signs    Vitals:   07/28/19 1930 07/28/19 2003 07/28/19 2235 07/29/19 0421  BP: 131/81 140/80 139/80 127/76  Pulse: 64 66 61 (!) 58  Resp: (!) 21 14  19   Temp:  97.9 F (36.6 C)  98.5 F (36.9 C)  TempSrc:  Oral  Oral  SpO2: 96% 96%  98%  Weight:  79.8 kg  79.5 kg  Height:        Intake/Output Summary (Last 24 hours) at 07/29/2019 0654 Last data filed at 07/29/2019 0300 Gross per 24 hour  Intake 1158.4 ml  Output 2540 ml  Net -1381.6 ml   Last 3 Weights 07/29/2019 07/28/2019 07/28/2019  Weight (lbs) 175 lb 4.3 oz 176 lb 170 lb  Weight (kg) 79.5 kg 79.833 kg 77.111 kg      Telemetry    Sinus, HR 60s - Personally Reviewed  ECG    Sinus brady, TWI III - Personally Reviewed  Physical Exam   GEN: No acute distress.   Neck: No JVD Cardiac: RRR, no murmurs, rubs, or gallops.  Respiratory: Clear to auscultation bilaterally. GI: Soft, nontender, non-distended  MS: No edema; No deformity. Neuro:  Nonfocal  Psych: Normal affect   Labs    High Sensitivity Troponin:   Recent Labs  Lab 07/28/19 1253 07/28/19 1455 07/28/19 1708 07/28/19 1912  TROPONINIHS 59* 131* 449* 859*      Chemistry Recent Labs  Lab 07/28/19 1253 07/29/19 0512  NA 138 141  K 3.6 3.6  CL 101 107  CO2 24 24  GLUCOSE 113* 96  BUN 26*  17  CREATININE 1.67* 1.23  CALCIUM 9.0 8.5*  GFRNONAA 44* >60  GFRAA 50* >60  ANIONGAP 13 10     Hematology Recent Labs  Lab 07/28/19 1253 07/29/19 0512  WBC 11.0* 9.5  RBC 4.87 4.83  HGB 14.7 14.2  HCT 41.8 41.9  MCV 85.8 86.7  MCH 30.2 29.4  MCHC 35.2 33.9  RDW 13.3 13.2  PLT 263 248    BNPNo results for input(s): BNP, PROBNP in the last 168 hours.   DDimer No results for input(s): DDIMER in the last 168 hours.   Radiology    DG Chest 2 View  Result Date: 07/28/2019 CLINICAL DATA:  Chest pain EXAM: CHEST - 2 VIEW COMPARISON:  None. FINDINGS: The heart size and mediastinal contours are within normal limits. Both lungs are clear. The visualized skeletal structures are unremarkable. IMPRESSION: No active cardiopulmonary disease. Electronically Signed   By: Kathreen Devoid   On: 07/28/2019 13:44    Cardiac Studies   Echo ordered Cardiac cath  Patient Profile     64 y.o. male with HTN, hypothyroid, GERD presents with chest pain.   Assessment & Plan  NSTEMI - HS troponin [id number] - EKG NSR with borderline ST depression inferolateral leads - started on Aspirin, metoprolol 25mg  daily, atorvastatin - Home lisinopril-HCTZ continued on admission - IV heparin - Echo ordered - LDL 97, HDL 32, TG 61 - A1C 5.7 - Plan for cardiac cath today Risks and benefits of cardiac catheterization have been discussed with the patient.  These include bleeding, infection, kidney damage, stroke, heart attack, death.  The patient understands these risks and is willing to proceed.  HTN - Home amlodipine and lisinopril-HCTZ - BB as above  Hypothyroid - synthroid - TSH ordered  For questions or updates, please contact [facility] Please consult [email or web address] for contact info under        Signed, Narely Nobles , PA-C  07/29/2019, 6:54 AM

## 2019-07-29 NOTE — Interval H&P Note (Signed)
History and Physical Interval Note:  07/29/2019 4:31 PM  Marlo Goodrich  has presented today for surgery, with the diagnosis of nstemi.  The various methods of treatment have been discussed with the patient and family. After consideration of risks, benefits and other options for treatment, the patient has consented to  Procedure(s): LEFT HEART CATH AND CORONARY ANGIOGRAPHY (N/A) as a surgical intervention.  The patient's history has been reviewed, patient examined, no change in status, stable for surgery.  I have reviewed the patient's chart and labs.  Questions were answered to the patient's satisfaction.   Cath Lab Visit (complete for each Cath Lab visit)  Clinical Evaluation Leading to the Procedure:   ACS: Yes.    Non-ACS:    Anginal Classification: CCS IV  Anti-ischemic medical therapy: Maximal Therapy (2 or more classes of medications)  Non-Invasive Test Results: No non-invasive testing performed  Prior CABG: No previous CABG        Theron Arista [facility] 07/29/2019 4:31 PM

## 2019-07-29 NOTE — Progress Notes (Signed)
  Echocardiogram 2D Echocardiogram has been performed.  Christian Baird 07/29/2019, 9:31 AM

## 2019-07-29 NOTE — Progress Notes (Signed)
ANTICOAGULATION CONSULT NOTE  Pharmacy Consult for heparin Indication: chest pain/ACS  No Known Allergies  Patient Measurements: Height: 5\' 6"  (167.6 cm) Weight: 79.5 kg (175 lb 4.3 oz) IBW/kg (Calculated) : 63.8 Heparin Dosing Weight: 77 kg  Vital Signs: Temp: 98.5 F (36.9 C) (06/09 0421) Temp Source: Oral (06/09 0421) BP: 127/Christian (06/09 0421) Pulse Rate: 58 (06/09 0421)  Labs: Recent Labs    07/28/19 1253 07/28/19 1253 07/28/19 1455 07/28/19 1708 07/28/19 1912 07/28/19 2130 07/29/19 0512  HGB 14.7  --   --   --   --   --  14.2  HCT 41.8  --   --   --   --   --  41.9  PLT 263  --   --   --   --   --  248  HEPARINUNFRC  --   --   --   --   --  0.41 0.41  CREATININE 1.67*  --   --   --   --   --  1.23  TROPONINIHS 59*   < > 131* 449* 859*  --   --    < > = values in this interval not displayed.    Estimated Creatinine Clearance: 62.5 mL/min (by C-G formula based on SCr of 1.23 mg/dL).   Medical History: Past Medical History:  Diagnosis Date  . GERD (gastroesophageal reflux disease)   . Hypertension   . Hypothyroidism     Medications:  Medications Prior to Admission  Medication Sig Dispense Refill Last Dose  . amLODipine (NORVASC) 10 MG tablet Take 10 mg by mouth daily.    07/28/2019 at Unknown time  . levothyroxine (SYNTHROID) 75 MCG tablet Take 75 mcg by mouth daily.    07/28/2019 at Unknown time  . lisinopril-hydrochlorothiazide (ZESTORETIC) 20-12.5 MG tablet Take 1 tablet by mouth daily.    07/28/2019 at Unknown time  . pantoprazole (PROTONIX) 40 MG tablet Take 40 mg by mouth daily.    07/28/2019 at Unknown time  . sildenafil (REVATIO) 20 MG tablet Take 20 mg by mouth daily as needed (For erectile dysfunction).    unknown at unknown    Assessment: Christian Baird presenting with NSTEMI. Pharmacy consulted to dose heparin. Heparin level therapeutic, CBC stable, cath planned today.   Goal of Therapy:  Heparin level 0.3-0.7 units/ml Monitor platelets by anticoagulation  protocol: Yes   Plan:  -Heparin 900 units/h -Daily heparin level and CBC   77, PharmD, BCPS Clinical Pharmacist [phone] Please check AMION for all [facility] numbers 07/29/2019

## 2019-07-30 ENCOUNTER — Encounter (HOSPITAL_COMMUNITY): Payer: Self-pay | Admitting: Cardiology

## 2019-07-30 DIAGNOSIS — E785 Hyperlipidemia, unspecified: Secondary | ICD-10-CM

## 2019-07-30 DIAGNOSIS — I10 Essential (primary) hypertension: Secondary | ICD-10-CM

## 2019-07-30 DIAGNOSIS — I2510 Atherosclerotic heart disease of native coronary artery without angina pectoris: Secondary | ICD-10-CM

## 2019-07-30 DIAGNOSIS — E039 Hypothyroidism, unspecified: Secondary | ICD-10-CM

## 2019-07-30 DIAGNOSIS — I2583 Coronary atherosclerosis due to lipid rich plaque: Secondary | ICD-10-CM

## 2019-07-30 LAB — BASIC METABOLIC PANEL
Anion gap: 11 (ref 5–15)
BUN: 20 mg/dL (ref 8–23)
CO2: 23 mmol/L (ref 22–32)
Calcium: 8.4 mg/dL — ABNORMAL LOW (ref 8.9–10.3)
Chloride: 108 mmol/L (ref 98–111)
Creatinine, Ser: 1.41 mg/dL — ABNORMAL HIGH (ref 0.61–1.24)
GFR calc Af Amer: >60 mL/min (ref >60)
GFR calc non Af Amer: 53 mL/min — ABNORMAL LOW (ref >60)
Glucose, Bld: 92 mg/dL (ref 70–99)
Potassium: 3.6 mmol/L (ref 3.5–5.1)
Sodium: 142 mmol/L (ref 135–145)

## 2019-07-30 LAB — CBC
HCT: 44.5 % (ref 39.0–52.0)
Hemoglobin: 14.9 g/dL (ref 13.0–17.0)
MCH: 29.5 pg (ref 26.0–34.0)
MCHC: 33.5 g/dL (ref 30.0–36.0)
MCV: 88.1 fL (ref 80.0–100.0)
Platelets: 247 10*3/uL (ref 150–400)
RBC: 5.05 MIL/uL (ref 4.22–5.81)
RDW: 13.2 % (ref 11.5–15.5)
WBC: 9.9 10*3/uL (ref 4.0–10.5)
nRBC: 0 % (ref 0.0–0.2)

## 2019-07-30 MED ORDER — TICAGRELOR 90 MG PO TABS: 90.0000 mg | ORAL_TABLET | Freq: Two times a day (BID) | ORAL | 3 refills | Status: DC

## 2019-07-30 MED ORDER — ATORVASTATIN CALCIUM 80 MG PO TABS: 80.0000 mg | ORAL_TABLET | Freq: Every day | ORAL | 6 refills | Status: DC

## 2019-07-30 MED ORDER — METOPROLOL SUCCINATE ER 25 MG PO TB24: 25.0000 mg | ORAL_TABLET | Freq: Every day | ORAL | 4 refills | Status: DC

## 2019-07-30 MED ORDER — HYDROCHLOROTHIAZIDE 12.5 MG PO CAPS: 12.5000 mg | ORAL_CAPSULE | Freq: Every day | ORAL | 3 refills | Status: DC

## 2019-07-30 MED ORDER — NITROGLYCERIN 0.4 MG SL SUBL: 0.4000 mg | SUBLINGUAL_TABLET | SUBLINGUAL | 1 refills | Status: DC | PRN

## 2019-07-30 MED ORDER — ASPIRIN 81 MG PO TBEC: 81.0000 mg | DELAYED_RELEASE_TABLET | Freq: Every day | ORAL | 3 refills | Status: DC

## 2019-07-30 NOTE — Progress Notes (Signed)
CARDIAC REHAB PHASE I   PRE:  Rate/Rhythm: 69 SR  BP:  Sitting: 161/83      SaO2: 97 RA  MODE:  Ambulation: 450 ft   POST:  Rate/Rhythm: 81 SR  BP:  Sitting: 157/85    SaO2: 98 RA  Pt ambulated 414ft in hallway independently with steady gait. Pt denies CP, SOB, or dizziness. Pt and wife educated on importance of ASA, Brilinta, statin, and NTG. Pt given MI book, stent card, and heart healthy diet. Reviewed restrictions, site care, and exercise guidelines. Pt deferring his trip to [facility] Talked about stress and ways to manage. Will refer to [facility] [place].  [id number] Reynold Bowen, RN BSN 07/30/2019 9:53 AM

## 2019-07-30 NOTE — Discharge Summary (Addendum)
Discharge Summary    Patient ID: Javar Eshbach MRN: [id number]; DOB: [birth year]  Admit date: 07/28/2019 Discharge date: 07/30/2019  Primary Care Provider: Wilburn Mylar, MD  Primary Cardiologist: Reatha Harps, MD  Primary Electrophysiologist:  None   Discharge Diagnoses    Principal Problem:   NSTEMI (non-ST elevated myocardial infarction) [facility]) Active Problems:   Hyperlipidemia   Hypertension   CAD (coronary artery disease) s/p DES to dRCA 07/2019   Hypothyroidism   Diagnostic Studies/Procedures    Cardiac catheterization 07/29/2019  Prox LAD to Mid LAD lesion is 30% stenosed.  Dist RCA lesion is 95% stenosed.  A drug-eluting stent was successfully placed using a STENT RESOLUTE ONYX [id number].  Post intervention, there is a 0% residual stenosis.  Prox RCA to Mid RCA lesion is 20% stenosed.  The left ventricular systolic function is normal.  LV end diastolic pressure is normal.  The left ventricular ejection fraction is 55-65% by visual estimate.   1. Single vessel obstructive CAD 2. Normal LV function 3. Normal LVEDP 4. Successful PCI of the distal RCA with DES x 1.  Plan: DAPT for one year. Anticipate DC in Am.  Coronary Diagrams  Diagnostic Dominance: Right  Intervention     Echo 07/29/2019 1. Left ventricular ejection fraction, by estimation, is 55 to 60%. The  left ventricle has normal function. The left ventricle has no regional  wall motion abnormalities. Left ventricular diastolic parameters were  normal.  2. Right ventricular systolic function is normal. The right ventricular  size is normal. Tricuspid regurgitation signal is inadequate for assessing  PA pressure.  3. Left atrial size was mildly dilated.  4. The mitral valve is normal in structure. Trivial mitral valve  regurgitation. No evidence of mitral stenosis.  5. The aortic valve is tricuspid. Aortic valve regurgitation is not  visualized. No aortic stenosis is present.  6.  There is a linear echodensity in the ascending aorta. This may be  artifact, but dissection cannot be excluded.. Aortic abnormal.  7. The inferior vena cava is normal in size with greater than 50%  respiratory variability, suggesting right atrial pressure of 3 mmHg _____________   History of Present Illness     Christian Baird is a 64 y.o. male with past medical history of hypertension, hypothyroidism, GERD who was seen in the ER 07/28/2019 for chest pain.  He before he was at work when he developed sudden onset of chest pain.  His [place]  Never felt this type of pain before.  He was at work and suddenly felt a painful burning sensation in the center of his chest. He had some SOB with this as well. The pain was non-radiating. The symptoms waxed and waned throughout the remainder of the day. They next day the symptoms returned and so he went to the ED.   Hospital Course     Consultants: None  On arrival to the ED the patient's vitals were significant for only mild hypertension. An ECG revealed ST depression and his initial troponin was elevated to 131. He was given aspirin and started on heparin drip. Symptoms resolved soon after that.  Patient was admitted for possible cath the next day. Home amlodipine and lisinopril/HCTZ were continued on admission. Synthroid continued as well.  Echo was ordered as well as lipid panel and A1c. Toprol 25 mg daily was started along with atorvastatin 80 mg and aspirin 81 mg.  Troponin trended up 449>859.  Lipid  panel came back with LDL 97, HDL 32, TG 61.  A1c 5.7.  TSH within normal limits.  Echo showed EF of 55 to 60%, no wall motion abnormalities, normal diastolic parameters, normal RV function, mildly dilated left atrium, trivial MR, and possible aortic dissection suspected artifact. Echo was reviewed by Dr. Audie Box who felt finding was artifact, normal aorta.  Cardiac catheterization showed single-vessel obstructive CAD  in the distal RCA with 95% stenosis, normal LV function, normal LVEDP.  Patient was treated with successful PCI DES x1 to the distal RCA.  Patient tolerated the procedure well. Plan for DAPT with Aspirin and Brilinta for 1 year.  Patient remained stable overnight.  Right radial catheterization site without any bleeding, tenderness, bruising. Patient worked with cardiac rehab and was able to ambulate without chest pain or sob. Follow-up labs showed creatinine 1.41 and hemoglobin 14.9.  Plan send in new prescriptions for aspirin, Brilinta, atorvastatin, metoprolol, and sublingual nitro. With minimally elevated creatinine will stop lisinopril but continue HCTZ.   Patient was evaluated by Dr. Audie Box on 07/30/2019 and felt to be stable for discharge.  GEN:No acute distress.   Neck:No JVD Cardiac:RRR, no murmurs, rubs, or gallops.  Respiratory:Clear to auscultation bilaterally. IH:KVQQ, nontender, non-distended  MS:No edema; No deformity; right radial site with no firmness, bruising, bleeding Neuro:Nonfocal  Psych: Normal affect   Did the patient have an acute coronary syndrome (MI, NSTEMI, STEMI, etc) this admission?:  Yes                               AHA/ACC Clinical Performance & Quality Measures: 1. Aspirin prescribed? - Yes 2. ADP Receptor Inhibitor (Plavix/Clopidogrel, Brilinta/Ticagrelor or Effient/Prasugrel) prescribed (includes medically managed patients)? - Yes 3. Beta Blocker prescribed? - Yes 4. High Intensity Statin (Lipitor 40-80mg  or Crestor 20-40mg ) prescribed? - Yes 5. EF assessed during THIS hospitalization? - Yes 6. For EF <40%, was ACEI/ARB prescribed? - Not Applicable (EF >/= 59%) 7. For EF <40%, Aldosterone Antagonist (Spironolactone or Eplerenone) prescribed? - Not Applicable (EF >/= 56%) 8. Cardiac Rehab Phase II ordered (including medically managed patients)? - Yes   _____________  Discharge Vitals Blood pressure (!) 146/86, pulse 67, temperature 98.3 F (36.8  C), temperature source Oral, resp. rate 16, height 5\' 6"  (1.676 m), weight 79.5 kg, SpO2 96 %.  Filed Weights   07/28/19 1248 07/28/19 2003 07/29/19 0421  Weight: 77.1 kg 79.8 kg 79.5 kg    Labs & Radiologic Studies    CBC Recent Labs    07/29/19 0512 07/30/19 0411  WBC 9.5 9.9  HGB 14.2 14.9  HCT 41.9 44.5  MCV 86.7 88.1  PLT 248 387   Basic Metabolic Panel Recent Labs    07/29/19 0512 07/30/19 0411  NA 141 142  K 3.6 3.6  CL 107 108  CO2 24 23  GLUCOSE 96 92  BUN 17 20  CREATININE 1.23 1.41*  CALCIUM 8.5* 8.4*   Liver Function Tests No results for input(s): AST, ALT, ALKPHOS, BILITOT, PROT, ALBUMIN in the last 72 hours. No results for input(s): LIPASE, AMYLASE in the last 72 hours. High Sensitivity Troponin:   Recent Labs  Lab 07/28/19 1253 07/28/19 1455 07/28/19 1708 07/28/19 1912  TROPONINIHS 59* 131* 449* 859*    BNP Invalid input(s): POCBNP D-Dimer No results for input(s): DDIMER in the last 72 hours. Hemoglobin A1C Recent Labs    07/29/19 0512  HGBA1C 5.7*   Fasting Lipid Panel Recent  Labs    07/29/19 0512  CHOL 141  HDL 32*  LDLCALC 97  TRIG 61  CHOLHDL 4.4   Thyroid Function Tests Recent Labs    07/29/19 0512  TSH 2.618   _____________  DG Chest 2 View  Result Date: 07/28/2019 CLINICAL DATA:  Chest pain EXAM: CHEST - 2 VIEW COMPARISON:  None. FINDINGS: The heart size and mediastinal contours are within normal limits. Both lungs are clear. The visualized skeletal structures are unremarkable. IMPRESSION: No active cardiopulmonary disease. Electronically Signed   By: Elige Ko   On: 07/28/2019 13:44   CARDIAC CATHETERIZATION  Result Date: 07/29/2019  Prox LAD to Mid LAD lesion is 30% stenosed.  Dist RCA lesion is 95% stenosed.  A drug-eluting stent was successfully placed using a STENT RESOLUTE ONYX [id number].  Post intervention, there is a 0% residual stenosis.  Prox RCA to Mid RCA lesion is 20% stenosed.  The left ventricular  systolic function is normal.  LV end diastolic pressure is normal.  The left ventricular ejection fraction is 55-65% by visual estimate.  1. Single vessel obstructive CAD 2. Normal LV function 3. Normal LVEDP 4. Successful PCI of the distal RCA with DES x 1. Plan: DAPT for one year. Anticipate DC in Am.   ECHOCARDIOGRAM COMPLETE  Result Date: 07/29/2019    ECHOCARDIOGRAM REPORT   Patient Name:   AM[facility] STURTEVANT Date of Exam: 07/29/2019 Medical Rec #:  [id number]       Height:       66.0 in Accession #:    [id number]      Weight:       175.3 lb Date of Birth:  1955/05/19       BSA:          1.891 m Patient Age:    63 years        BP:           140/83 mmHg Patient Gender: M               HR:           55 bpm. Exam Location:  Inpatient Procedure: 2D Echo Indications:    acute myocardial infarction 410  History:        Patient has no prior history of Echocardiogram examinations.  Sonographer:    Delcie Roch Referring Phys: [id number] ANTHONY PHILIP CARNICELLI IMPRESSIONS  1. Left ventricular ejection fraction, by estimation, is 55 to 60%. The left ventricle has normal function. The left ventricle has no regional wall motion abnormalities. Left ventricular diastolic parameters were normal.  2. Right ventricular systolic function is normal. The right ventricular size is normal. Tricuspid regurgitation signal is inadequate for assessing PA pressure.  3. Left atrial size was mildly dilated.  4. The mitral valve is normal in structure. Trivial mitral valve regurgitation. No evidence of mitral stenosis.  5. The aortic valve is tricuspid. Aortic valve regurgitation is not visualized. No aortic stenosis is present.  6. There is a linear echodensity in the ascending aorta. This may be artifact, but dissection cannot be excluded.. Aortic abnormal.  7. The inferior vena cava is normal in size with greater than 50% respiratory variability, suggesting right atrial pressure of 3 mmHg. Comparison(s): No prior Echocardiogram.  Conclusion(s)/Recommendation(s): Cannot exclude dissection. Finding communicated to Dr. Flora Lipps. FINDINGS  Left Ventricle: Left ventricular ejection fraction, by estimation, is 55 to 60%. The left ventricle has normal function. The left ventricle has no regional wall motion abnormalities. The left ventricular internal  cavity size was normal in size. There is  no left ventricular hypertrophy. Left ventricular diastolic parameters were normal. Right Ventricle: The right ventricular size is normal. No increase in right ventricular wall thickness. Right ventricular systolic function is normal. Tricuspid regurgitation signal is inadequate for assessing PA pressure. Left Atrium: Left atrial size was mildly dilated. Right Atrium: Right atrial size was normal in size. Pericardium: Trivial pericardial effusion is present. Mitral Valve: The mitral valve is normal in structure. Trivial mitral valve regurgitation. No evidence of mitral valve stenosis. Tricuspid Valve: The tricuspid valve is normal in structure. Tricuspid valve regurgitation is trivial. Aortic Valve: The aortic valve is tricuspid. Aortic valve regurgitation is not visualized. No aortic stenosis is present. Pulmonic Valve: The pulmonic valve was grossly normal. Pulmonic valve regurgitation is trivial. No evidence of pulmonic stenosis. Aorta: There is a linear echodensity in the ascending aorta. This may be artifact, but dissection cannot be excluded. Abnormal. Venous: The inferior vena cava is normal in size with greater than 50% respiratory variability, suggesting right atrial pressure of 3 mmHg. IAS/Shunts: No atrial level shunt detected by color flow Doppler.  LEFT VENTRICLE PLAX 2D LVIDd:         5.00 cm  Diastology LVIDs:         3.30 cm  LV e' lateral:   10.70 cm/s LV PW:         1.10 cm  LV E/e' lateral: 8.3 LV IVS:        0.90 cm  LV e' medial:    7.94 cm/s LVOT diam:     2.10 cm  LV E/e' medial:  11.2 LV SV:         76 LV SV Index:   40 LVOT Area:      3.46 cm  RIGHT VENTRICLE             IVC RV S prime:     12.30 cm/s  IVC diam: 1.90 cm TAPSE (M-mode): 2.4 cm LEFT ATRIUM             Index       RIGHT ATRIUM           Index LA diam:        4.00 cm 2.12 cm/m  RA Area:     12.30 cm LA Vol (A2C):   57.3 ml 30.31 ml/m RA Volume:   26.30 ml  13.91 ml/m LA Vol (A4C):   52.6 ml 27.82 ml/m LA Biplane Vol: 56.1 ml 29.67 ml/m  AORTIC VALVE LVOT Vmax:   88.10 cm/s LVOT Vmean:  55.600 cm/s LVOT VTI:    0.219 m  AORTA Ao Root diam: 3.50 cm Ao Asc diam:  3.10 cm MITRAL VALVE MV Area (PHT): 3.89 cm    SHUNTS MV Decel Time: 195 msec    Systemic VTI:  0.22 m MV E velocity: 89.10 cm/s  Systemic Diam: 2.10 cm MV A velocity: 82.30 cm/s MV E/A ratio:  1.08 Jodelle RedBridgette Christopher MD Electronically signed by Jodelle RedBridgette Christopher MD Signature Date/Time: 07/29/2019/2:08:02 PM    Final    Disposition   Pt is being discharged home today in good condition.  Follow-up Plans & Appointments     Follow-up Information    O'Neal, Ronnald RampWesley Thomas, MD Follow up on 08/11/2019.   Specialties: Internal Medicine, Cardiology, Radiology Why: @ 10:20AM Contact information: [address] [place] [address] [phone]              Discharge Instructions  Amb Referral to Cardiac Rehabilitation   Complete by: As directed    Will send Cardiac Rehab Phase 2 referral to [facility]   Diagnosis:  Coronary Stents NSTEMI     After initial evaluation and assessments completed: Virtual Based Care may be provided alone or in conjunction with Phase 2 Cardiac Rehab based on patient barriers.: Yes      Discharge Medications   Allergies as of 07/30/2019   No Known Allergies     Medication List    STOP taking these medications   lisinopril-hydrochlorothiazide 20-12.5 MG tablet Commonly known as: ZESTORETIC   sildenafil 20 MG tablet Commonly known as: REVATIO     TAKE these medications   amLODipine 10 MG tablet Commonly known as: NORVASC Take 10 mg by mouth  daily.   aspirin 81 MG EC tablet Take 1 tablet (81 mg total) by mouth daily. Start taking on: July 31, 2019   atorvastatin 80 MG tablet Commonly known as: LIPITOR Take 1 tablet (80 mg total) by mouth daily.   hydrochlorothiazide 12.5 MG capsule Commonly known as: MICROZIDE Take 1 capsule (12.5 mg total) by mouth daily. Start taking on: July 31, 2019   levothyroxine 75 MCG tablet Commonly known as: SYNTHROID Take 75 mcg by mouth daily.   metoprolol succinate 25 MG 24 hr tablet Commonly known as: TOPROL-XL Take 1 tablet (25 mg total) by mouth daily. Start taking on: July 31, 2019   nitroGLYCERIN 0.4 MG SL tablet Commonly known as: NITROSTAT Place 1 tablet (0.4 mg total) under the tongue every 5 (five) minutes x 3 doses as needed for chest pain.   pantoprazole 40 MG tablet Commonly known as: PROTONIX Take 40 mg by mouth daily.   ticagrelor 90 MG Tabs tablet Commonly known as: BRILINTA Take 1 tablet (90 mg total) by mouth 2 (two) times daily.          Outstanding Labs/Studies   BMET at follow-up  Duration of Discharge Encounter   Greater than 30 minutes including physician time.  Signed, Waynesha Rammel David Stall, PA-C 07/30/2019, 9:59 AM

## 2019-07-31 ENCOUNTER — Telehealth (HOSPITAL_COMMUNITY): Payer: Self-pay

## 2019-07-31 NOTE — Telephone Encounter (Signed)
Faxed pt cardiac rehab referral to [facility] cardiac rehab per Phase I. 

## 2019-08-09 NOTE — Progress Notes (Signed)
Cardiology Office Note:   Date:  08/11/2019  NAME:  Estil DaftRonald Pierro    MRN: [id number] DOB:  [birth year]   PCP:  Wilburn MylarKelly, Samuel S, MD  Cardiologist:  Reatha HarpsWesley T O'Neal, MD  Electrophysiologist:  None   Referring MD: Wilburn MylarKelly, Samuel S, MD   Chief Complaint  Patient presents with   Follow-up   History of Present Illness:   Estil DaftRonald Gordan is a 64 y.o. male with a hx of CAD, HTN, HLD who presents for follow-up. Recent admission 07/29/2019 for NSTEMI and underwent PCI to the distal RCA.  He reports he is doing well.  He left the hospital.  No further episodes of chest pain.  He reports he has been walking with his wife.  They are up to 45 minutes/day.  Seems to be doing well without any angina or shortness of breath.  His right radial cath site is clean and dry.  His EKG shows normal sinus rhythm with inferior T wave inversions.  He seems to be doing quite well.  LDL is not at goal.  We do need to recheck his lipid profile in 2 months.  Blood pressure is well controlled today.  He has not returned to work.  [place]  He does lift heavy luggage.  We have a plan to go back on July 9.  He denies any shortness of breath or palpitations today.  Overall doing well.  Problem List 1. NSTEMI 07/29/2019 -DES to dRCA 2. HTN 3. HLD -T chol 141, HDL 32, LDL 97, TG 61  Past Medical History: Past Medical History:  Diagnosis Date   Coronary artery disease    GERD (gastroesophageal reflux disease)    Hypertension    Hypothyroidism     Past Surgical History: Past Surgical History:  Procedure Laterality Date   ABDOMINAL SURGERY     CORONARY STENT INTERVENTION N/A 07/29/2019   Procedure: CORONARY STENT INTERVENTION;  Surgeon: SwazilandJordan, Peter M, MD;  Location: [facility] INVASIVE CV LAB;  Service: Cardiovascular;  Laterality: N/A;   FRACTURE SURGERY     LEFT HEART CATH AND CORONARY ANGIOGRAPHY N/A 07/29/2019   Procedure: LEFT HEART CATH AND CORONARY ANGIOGRAPHY;  Surgeon:  SwazilandJordan, Peter M, MD;  [facility];  Service: Cardiovascular;  Laterality: N/A;    Current Medications: Current Meds  Medication Sig   amLODipine (NORVASC) 10 MG tablet Take 1 tablet (10 mg total) by mouth daily.   aspirin 81 MG EC tablet Take 1 tablet (81 mg total) by mouth daily.   atorvastatin (LIPITOR) 80 MG tablet Take 1 tablet (80 mg total) by mouth daily.   hydrochlorothiazide (MICROZIDE) 12.5 MG capsule Take 1 capsule (12.5 mg total) by mouth daily.   levothyroxine (SYNTHROID) 75 MCG tablet Take 75 mcg by mouth daily.    metoprolol succinate (TOPROL-XL) 25 MG 24 hr tablet Take 1 tablet (25 mg total) by mouth daily.   nitroGLYCERIN (NITROSTAT) 0.4 MG SL tablet Place 1 tablet (0.4 mg total) under the tongue every 5 (five) minutes x 3 doses as needed for chest pain.   pantoprazole (PROTONIX) 40 MG tablet Take 40 mg by mouth daily.    ticagrelor (BRILINTA) 90 MG TABS tablet Take 1 tablet (90 mg total) by mouth 2 (two) times daily.   [DISCONTINUED] amLODipine (NORVASC) 10 MG tablet Take 10 mg by mouth daily.    [DISCONTINUED] aspirin EC 81 MG EC tablet Take 1 tablet (81 mg total) by mouth daily.   [  DISCONTINUED] atorvastatin (LIPITOR) 80 MG tablet Take 1 tablet (80 mg total) by mouth daily.   [DISCONTINUED] hydrochlorothiazide (MICROZIDE) 12.5 MG capsule Take 1 capsule (12.5 mg total) by mouth daily.   [DISCONTINUED] metoprolol succinate (TOPROL-XL) 25 MG 24 hr tablet Take 1 tablet (25 mg total) by mouth daily.   [DISCONTINUED] ticagrelor (BRILINTA) 90 MG TABS tablet Take 1 tablet (90 mg total) by mouth 2 (two) times daily.     Allergies:    Patient has no known allergies.   Social History: Social History   Socioeconomic History   Marital status: Married    Spouse name: Not on file   Number of children: Not on file   Years of education: Not on file   Highest education level: Not on file  Occupational History   Not on file  Tobacco Use    Smoking status: Never Smoker   Smokeless tobacco: Never Used  Vaping Use   Vaping Use: Never used  Substance and Sexual Activity   Alcohol use: Never    Alcohol/week: 0.0 standard drinks   Drug use: Never   Sexual activity: Not on file  Other Topics Concern   Not on file  Social History Narrative   Not on file   Social Determinants of Health   Financial Resource Strain:    Difficulty of Paying Living Expenses:   Food Insecurity:    Worried About Programme researcher, broadcasting/film/video in the Last Year:    Barista in the Last Year:   Transportation Needs:    Freight forwarder (Medical):    Lack of Transportation (Non-Medical):   Physical Activity:    Days of Exercise per Week:    Minutes of Exercise per Session:   Stress:    Feeling of Stress :   Social Connections:    Frequency of Communication with Friends and Family:    Frequency of Social Gatherings with Friends and Family:    Attends Religious Services:    Active Member of Clubs or Organizations:    Attends Banker Meetings:    Marital Status:      Family History: The patient's family history includes Hypertension in his mother.  ROS:   All other ROS reviewed and negative. Pertinent positives noted in the HPI.     EKGs/Labs/Other Studies Reviewed:   The following studies were personally reviewed by me today:  EKG:  EKG is ordered today.  The ekg ordered today demonstrates normal sinus rhythm, heart 58, T wave inversions noted in the inferior leads, and was personally reviewed by me.   TTE 07/29/2019 1. Left ventricular ejection fraction, by estimation, is 55 to 60%. The  left ventricle has normal function. The left ventricle has no regional  wall motion abnormalities. Left ventricular diastolic parameters were  normal.  2. Right ventricular systolic function is normal. The right ventricular  size is normal. Tricuspid regurgitation signal is inadequate for assessing  PA pressure.    3. Left atrial size was mildly dilated.  4. The mitral valve is normal in structure. Trivial mitral valve  regurgitation. No evidence of mitral stenosis.  5. The aortic valve is tricuspid. Aortic valve regurgitation is not  visualized. No aortic stenosis is present.  6. There is a linear echodensity in the ascending aorta. This may be  artifact, but dissection cannot be excluded.. Aortic abnormal.  7. The inferior vena cava is normal in size with greater than 50%  respiratory variability, suggesting right atrial pressure of  3 mmHg.   LHC 07/29/2019  Prox LAD to Mid LAD lesion is 30% stenosed.  Dist RCA lesion is 95% stenosed.  A drug-eluting stent was successfully placed using a STENT RESOLUTE ONYX [id number].  Post intervention, there is a 0% residual stenosis.  Prox RCA to Mid RCA lesion is 20% stenosed.  The left ventricular systolic function is normal.  LV end diastolic pressure is normal.  The left ventricular ejection fraction is 55-65% by visual estimate.   1. Single vessel obstructive CAD 2. Normal LV function 3. Normal LVEDP 4. Successful PCI of the distal RCA with DES x 1.   Recent Labs: 07/29/2019: TSH 2.618 07/30/2019: BUN 20; Creatinine, Ser 1.41; Hemoglobin 14.9; Platelets 247; Potassium 3.6; Sodium 142   Recent Lipid Panel    Component Value Date/Time   CHOL 141 07/29/2019 0512   TRIG 61 07/29/2019 0512   HDL 32 (L) 07/29/2019 0512   CHOLHDL 4.4 07/29/2019 0512   VLDL 12 07/29/2019 0512   LDLCALC 97 07/29/2019 0512    Physical Exam:   VS:  BP 140/82 (BP Location: Right Arm, Patient Position: Sitting, Cuff Size: Normal)    Pulse (!) 58    Temp (!) 97.4 F (36.3 C)    Ht 5\' 6"  (1.676 m)    Wt 176 lb (79.8 kg)    SpO2 98%    BMI 28.41 kg/m    Wt Readings from Last 3 Encounters:  08/11/19 176 lb (79.8 kg)  07/29/19 175 lb 4.3 oz (79.5 kg)  05/11/15 173 lb (78.5 kg)    General: Well nourished, well developed, in no acute distress Heart: Atraumatic,  normal size  Eyes: PEERLA, EOMI  Neck: Supple, no JVD Endocrine: No thryomegaly Cardiac: Normal S1, S2; RRR; no murmurs, rubs, or gallops Lungs: Clear to auscultation bilaterally, no wheezing, rhonchi or rales  Abd: Soft, nontender, no hepatomegaly  Ext: No edema, pulses 2+ Musculoskeletal: No deformities, BUE and BLE strength normal and equal Skin: Warm and dry, no rashes   Neuro: Alert and oriented to person, place, time, and situation, CNII-XII grossly intact, no focal deficits  Psych: Normal mood and affect   ASSESSMENT:   Longino Trefz is a 64 y.o. male who presents for the following: 1. Coronary artery disease involving native coronary artery of native heart without angina pectoris   2. Essential hypertension   3. Mixed hyperlipidemia     PLAN:   1. Coronary artery disease involving native coronary artery of native heart without angina pectoris -Non-STEMI 07/28/2019.  Drug-eluting stent to distal RCA.  Right radial cath site clean and dry today.  Appears to be doing well.  We have a plan for him to return to work on August 28, 2019.  He will continue moderate activity up until that time.  He will then resume full activity on August 28, 2018 without restrictions.  He will remain on aspirin and ticagrelor for 1 year.  We will plan to recheck a lipid profile in 2 months to get his LDL less than 70.  He will continue on Lipitor 80 mg daily.  He is on metoprolol succinate 25 mg daily and will continue beta-blocker for 3 years.  Ejection fraction normal.  He has no evidence of heart failure examination.  We will continue with aggressive secondary risk factor modification.  2. Essential hypertension -Continue amlodipine, metoprolol, HCTZ  3. Mixed hyperlipidemia -LDL cholesterol 97.  Continue Lipitor 80 mg daily.  Recheck in 2 months.   Disposition: Return in  about 2 months (around 10/11/2019).  Medication Adjustments/Labs and Tests Ordered: Current medicines are reviewed at length with the  patient today.  Concerns regarding medicines are outlined above.  Orders Placed This Encounter  Procedures   Lipid panel   EKG 12-Lead   Meds ordered this encounter  Medications   ticagrelor (BRILINTA) 90 MG TABS tablet    Sig: Take 1 tablet (90 mg total) by mouth 2 (two) times daily.    Dispense:  90 tablet    Refill:  3   metoprolol succinate (TOPROL-XL) 25 MG 24 hr tablet    Sig: Take 1 tablet (25 mg total) by mouth daily.    Dispense:  60 tablet    Refill:  4   hydrochlorothiazide (MICROZIDE) 12.5 MG capsule    Sig: Take 1 capsule (12.5 mg total) by mouth daily.    Dispense:  60 capsule    Refill:  3   atorvastatin (LIPITOR) 80 MG tablet    Sig: Take 1 tablet (80 mg total) by mouth daily.    Dispense:  60 tablet    Refill:  6   aspirin 81 MG EC tablet    Sig: Take 1 tablet (81 mg total) by mouth daily.    Dispense:  90 tablet    Refill:  3   amLODipine (NORVASC) 10 MG tablet    Sig: Take 1 tablet (10 mg total) by mouth daily.    Dispense:  30 tablet    Refill:  44    Patient Instructions  Medication Instructions:  The current medical regimen is effective;  continue present plan and medications.  *If you need a refill on your cardiac medications before your next appointment, please call your pharmacy*   Lab Work: LIPID (come one week before the follow up appointment, no appointment needed, come fasting)  If you have labs (blood work) drawn today and your tests are completely normal, you will receive your results only by:  MyChart Message (if you have MyChart) OR  A paper copy in the mail If you have any lab test that is abnormal or we need to change your treatment, we will call you to review the results.   Follow-Up: At [facility], you and your health needs are our priority.  As part of our continuing mission to provide you with exceptional heart care, we have created designated Provider Care Teams.  These Care Teams include your primary  Cardiologist (physician) and Advanced Practice Providers (APPs -  Physician Assistants and Nurse Practitioners) who all work together to provide you with the care you need, when you need it.  We recommend signing up for the patient portal called "MyChart".  Sign up information is provided on this After Visit Summary.  MyChart is used to connect with patients for Virtual Visits (Telemedicine).  Patients are able to view lab/test results, encounter notes, upcoming appointments, etc.  Non-urgent messages can be sent to your provider as well.   To learn more about what you can do with MyChart, go to [email or web address].    Your next appointment:   2 month(s)  The format for your next appointment:   In Person  Provider:   Lennie Odor, MD      Time Spent with Patient: I have spent a total of 35 minutes with patient reviewing hospital notes, telemetry, EKGs, labs and examining the patient as well as establishing an assessment and plan that was discussed with the patient.  > 50% of time was spent in  direct patient care.  Signed, Addison Naegeli. Audie Box, [place]  [address], [place], [place] [address] [phone]  08/11/2019 10:42 AM

## 2019-08-11 ENCOUNTER — Other Ambulatory Visit: Payer: Self-pay

## 2019-08-11 ENCOUNTER — Ambulatory Visit (INDEPENDENT_AMBULATORY_CARE_PROVIDER_SITE_OTHER): Payer: BC Managed Care – PPO | Admitting: Cardiovascular Disease

## 2019-08-11 ENCOUNTER — Encounter: Payer: Self-pay | Admitting: Cardiovascular Disease

## 2019-08-11 VITALS — BP 140/82 | HR 58 | Temp 97.4°F | Ht 66.0 in | Wt 176.0 lb

## 2019-08-11 DIAGNOSIS — I2510 Atherosclerotic heart disease of native coronary artery without angina pectoris: Secondary | ICD-10-CM

## 2019-08-11 DIAGNOSIS — E782 Mixed hyperlipidemia: Secondary | ICD-10-CM

## 2019-08-11 DIAGNOSIS — I10 Essential (primary) hypertension: Secondary | ICD-10-CM

## 2019-08-11 MED ORDER — AMLODIPINE BESYLATE 10 MG PO TABS: 10.0000 mg | ORAL_TABLET | Freq: Every day | ORAL | 44 refills | Status: DC

## 2019-08-11 MED ORDER — HYDROCHLOROTHIAZIDE 12.5 MG PO CAPS: 12.5000 mg | ORAL_CAPSULE | Freq: Every day | ORAL | 3 refills | Status: DC

## 2019-08-11 MED ORDER — METOPROLOL SUCCINATE ER 25 MG PO TB24: 25.0000 mg | ORAL_TABLET | Freq: Every day | ORAL | 4 refills | Status: DC

## 2019-08-11 MED ORDER — ASPIRIN 81 MG PO TBEC: 81.0000 mg | DELAYED_RELEASE_TABLET | Freq: Every day | ORAL | 3 refills | Status: DC

## 2019-08-11 MED ORDER — TICAGRELOR 90 MG PO TABS: 90.0000 mg | ORAL_TABLET | Freq: Two times a day (BID) | ORAL | 3 refills | Status: DC

## 2019-08-11 MED ORDER — ATORVASTATIN CALCIUM 80 MG PO TABS: 80.0000 mg | ORAL_TABLET | Freq: Every day | ORAL | 6 refills | Status: DC

## 2019-08-11 NOTE — Patient Instructions (Signed)
Medication Instructions:  The current medical regimen is effective;  continue present plan and medications.  *If you need a refill on your cardiac medications before your next appointment, please call your pharmacy*   Lab Work: LIPID (come one week before the follow up appointment, no appointment needed, come fasting)  If you have labs (blood work) drawn today and your tests are completely normal, you will receive your results only by: Marland Kitchen MyChart Message (if you have MyChart) OR . A paper copy in the mail If you have any lab test that is abnormal or we need to change your treatment, we will call you to review the results.   Follow-Up: At [facility], you and your health needs are our priority.  As part of our continuing mission to provide you with exceptional heart care, we have created designated Provider Care Teams.  These Care Teams include your primary Cardiologist (physician) and Advanced Practice Providers (APPs -  Physician Assistants and Nurse Practitioners) who all work together to provide you with the care you need, when you need it.  We recommend signing up for the patient portal called "MyChart".  Sign up information is provided on this After Visit Summary.  MyChart is used to connect with patients for Virtual Visits (Telemedicine).  Patients are able to view lab/test results, encounter notes, upcoming appointments, etc.  Non-urgent messages can be sent to your provider as well.   To learn more about what you can do with MyChart, go to [email or web address].    Your next appointment:   2 month(s)  The format for your next appointment:   In Person  Provider:   Lennie Odor, MD

## 2019-09-03 NOTE — Telephone Encounter (Signed)
Have him see one of the APPs in the next 1-2 weeks.  Gerri Spore T. Flora Lipps, MD [facility] [place]  [address], [address] [place], [place] [address] [phone]  12:03 PM

## 2019-09-15 NOTE — Telephone Encounter (Signed)
Pt has appt scheduled 09/16/2019 @ 2:20 PM with Dr Flora Lipps

## 2019-09-15 NOTE — Progress Notes (Signed)
Cardiology Office Note:   Date:  09/16/2019  NAME:  Christian Baird    MRN: [id number] DOB:  Jul 06, 1955   PCP:  Wilburn Mylar, MD  Cardiologist:  Reatha Harps, MD   Referring MD: Wilburn Mylar, MD   Chief Complaint  Patient presents with  . Leg Swelling   History of Present Illness:   Christian Baird is a 64 y.o. male with a hx of CAD s/p DES to dRCA, HTN, HLD who presents for follow-up of LE edema.  He reports since returning to work he has noticed increased lower extremity edema.  He reports by the end of the day his legs are puffy.  This is concerned he and his wife.  He apparently has been on amlodipine for years.  No recent medication changes other than the fact that we stopped his lisinopril in the hospital and put him on metoprolol.  His blood pressure is 120/72.  [place] does [place].  He is working in the check in department of the airport.  He describes no chest pain or shortness of breath.  He is return to work without any limitations.  He denies any shortness of breath with activity.  He also reports no cough or symptoms of PND orthopnea.  Problem List 1. NSTEMI 07/29/2019 -DES to dRCA 2. HTN 3. HLD -T chol 141, HDL 32, LDL 97, TG 61  Past Medical History: Past Medical History:  Diagnosis Date  . Coronary artery disease   . GERD (gastroesophageal reflux disease)   . Hypertension   . Hypothyroidism     Past Surgical History: Past Surgical History:  Procedure Laterality Date  . ABDOMINAL SURGERY    . CORONARY STENT INTERVENTION N/A 07/29/2019   Procedure: CORONARY STENT INTERVENTION;  Surgeon: Swaziland, Peter M, MD;  Location: [facility] INVASIVE CV LAB;  Service: Cardiovascular;  Laterality: N/A;  . FRACTURE SURGERY    . LEFT HEART CATH AND CORONARY ANGIOGRAPHY N/A 07/29/2019   Procedure: LEFT HEART CATH AND CORONARY ANGIOGRAPHY;  Surgeon: Swaziland, Peter M, MD;  Location: [facility] INVASIVE CV LAB;  Service: Cardiovascular;  Laterality: N/A;    Current  Medications: Current Meds  Medication Sig  . aspirin 81 MG EC tablet Take 1 tablet (81 mg total) by mouth daily.  Marland Kitchen atorvastatin (LIPITOR) 80 MG tablet Take 1 tablet (80 mg total) by mouth daily.  Marland Kitchen levothyroxine (SYNTHROID) 75 MCG tablet Take 75 mcg by mouth daily.   . metoprolol succinate (TOPROL-XL) 25 MG 24 hr tablet Take 1 tablet (25 mg total) by mouth daily.  . nitroGLYCERIN (NITROSTAT) 0.4 MG SL tablet Place 1 tablet (0.4 mg total) under the tongue every 5 (five) minutes x 3 doses as needed for chest pain.  . pantoprazole (PROTONIX) 40 MG tablet Take 40 mg by mouth daily.   . ticagrelor (BRILINTA) 90 MG TABS tablet Take 1 tablet (90 mg total) by mouth 2 (two) times daily.  . [DISCONTINUED] amLODipine (NORVASC) 10 MG tablet Take 1 tablet (10 mg total) by mouth daily.  . [DISCONTINUED] hydrochlorothiazide (MICROZIDE) 12.5 MG capsule Take 1 capsule (12.5 mg total) by mouth daily.     Allergies:    Patient has no known allergies.   Social History: Social History   Socioeconomic History  . Marital status: Married    Spouse name: Not on file  . Number of children: Not on file  . Years of education: Not on file  . Highest education level: Not on file  Occupational  History  . Not on file  Tobacco Use  . Smoking status: Never Smoker  . Smokeless tobacco: Never Used  Vaping Use  . Vaping Use: Never used  Substance and Sexual Activity  . Alcohol use: Never    Alcohol/week: 0.0 standard drinks  . Drug use: Never  . Sexual activity: Not on file  Other Topics Concern  . Not on file  Social History Narrative  . Not on file   Social Determinants of Health   Financial Resource Strain:   . Difficulty of Paying Living Expenses:   Food Insecurity:   . Worried About Programme researcher, broadcasting/film/video in the Last Year:   . Barista in the Last Year:   Transportation Needs:   . Freight forwarder (Medical):   Marland Kitchen Lack of Transportation (Non-Medical):   Physical Activity:   . Days of  Exercise per Week:   . Minutes of Exercise per Session:   Stress:   . Feeling of Stress :   Social Connections:   . Frequency of Communication with Friends and Family:   . Frequency of Social Gatherings with Friends and Family:   . Attends Religious Services:   . Active Member of Clubs or Organizations:   . Attends Banker Meetings:   Marland Kitchen Marital Status:      Family History: The patient's family history includes Hypertension in his mother.  ROS:   All other ROS reviewed and negative. Pertinent positives noted in the HPI.     EKGs/Labs/Other Studies Reviewed:   The following studies were personally reviewed by me today:   TTE 07/29/2019  1. Left ventricular ejection fraction, by estimation, is 55 to 60%. The  left ventricle has normal function. The left ventricle has no regional  wall motion abnormalities. Left ventricular diastolic parameters were  normal.  2. Right ventricular systolic function is normal. The right ventricular  size is normal. Tricuspid regurgitation signal is inadequate for assessing  PA pressure.  3. Left atrial size was mildly dilated.  4. The mitral valve is normal in structure. Trivial mitral valve  regurgitation. No evidence of mitral stenosis.  5. The aortic valve is tricuspid. Aortic valve regurgitation is not  visualized. No aortic stenosis is present.  6. There is a linear echodensity in the ascending aorta. This may be  artifact, but dissection cannot be excluded.. Aortic abnormal.  7. The inferior vena cava is normal in size with greater than 50%  respiratory variability, suggesting right atrial pressure of 3 mmHg.   LHC 07/29/2019  Prox LAD to Mid LAD lesion is 30% stenosed.  Dist RCA lesion is 95% stenosed.  A drug-eluting stent was successfully placed using a STENT RESOLUTE ONYX [id number].  Post intervention, there is a 0% residual stenosis.  Prox RCA to Mid RCA lesion is 20% stenosed.  The left ventricular systolic  function is normal.  LV end diastolic pressure is normal.  The left ventricular ejection fraction is 55-65% by visual estimate.   1. Single vessel obstructive CAD 2. Normal LV function 3. Normal LVEDP 4. Successful PCI of the distal RCA with DES x 1.   Recent Labs: 07/29/2019: TSH 2.618 07/30/2019: BUN 20; Creatinine, Ser 1.41; Hemoglobin 14.9; Platelets 247; Potassium 3.6; Sodium 142   Recent Lipid Panel    Component Value Date/Time   CHOL 141 07/29/2019 0512   TRIG 61 07/29/2019 0512   HDL 32 (L) 07/29/2019 0512   CHOLHDL 4.4 07/29/2019 0512   VLDL 12  07/29/2019 0512   LDLCALC 97 07/29/2019 0512    Physical Exam:   VS:  BP 128/72   Pulse 67   Ht 5\' 6"  (1.676 m)   Wt 175 lb 12.8 oz (79.7 kg)   SpO2 97%   BMI 28.37 kg/m    Wt Readings from Last 3 Encounters:  09/16/19 175 lb 12.8 oz (79.7 kg)  08/11/19 176 lb (79.8 kg)  07/29/19 175 lb 4.3 oz (79.5 kg)    General: Well nourished, well developed, in no acute distress Heart: Atraumatic, normal size  Eyes: PEERLA, EOMI  Neck: Supple, no JVD Endocrine: No thryomegaly Cardiac: Normal S1, S2; RRR; no murmurs, rubs, or gallops Lungs: Clear to auscultation bilaterally, no wheezing, rhonchi or rales  Abd: Soft, nontender, no hepatomegaly  Ext: Trace edema Musculoskeletal: No deformities, BUE and BLE strength normal and equal Skin: Warm and dry, no rashes   Neuro: Alert and oriented to person, place, time, and situation, CNII-XII grossly intact, no focal deficits  Psych: Normal mood and affect   ASSESSMENT:   Christian Baird is a 64 y.o. male who presents for the following: 1. Leg edema   2. Coronary artery disease involving native coronary artery of native heart without angina pectoris   3. Essential hypertension   4. Mixed hyperlipidemia     PLAN:   1. Leg edema -Reports with complaints of lower extremity edema.  There is faint and trace edema on examination today.  I suspect this is related Norvasc.  We will stop  his Norvasc and increase his HCTZ to 25 mg daily.  He has no symptoms of congestive heart failure.  Lungs are clear.  2. Coronary artery disease involving native coronary artery of native heart without angina pectoris -NSTEMI 07/29/2019 -DES to dRCA -Continue aspirin and Brilinta.  Ejection fraction normal.  We have plans to see him back in a month for repeat lipid profile.  No changes at plan.  3. Essential hypertension -Stop amlodipine.  Increase HCTZ to 25 mg daily.  Continue metoprolol succinate.  4. Mixed hyperlipidemia -Continue statin.  Plans to recheck lipid profile prior to next visit.   Disposition: Return if symptoms worsen or fail to improve.  Medication Adjustments/Labs and Tests Ordered: Current medicines are reviewed at length with the patient today.  Concerns regarding medicines are outlined above.  No orders of the defined types were placed in this encounter.  Meds ordered this encounter  Medications  . hydrochlorothiazide (HYDRODIURIL) 25 MG tablet    Sig: Take 1 tablet (25 mg total) by mouth daily.    Dispense:  90 tablet    Refill:  3    Patient Instructions  Medication Instructions:  Stop Amlodipine  Increase HCTZ to 25 mg daily   *If you need a refill on your cardiac medications before your next appointment, please call your pharmacy*  Follow-Up: At [facility], you and your health needs are our priority.  As part of our continuing mission to provide you with exceptional heart care, we have created designated Provider Care Teams.  These Care Teams include your primary Cardiologist (physician) and Advanced Practice Providers (APPs -  Physician Assistants and Nurse Practitioners) who all work together to provide you with the care you need, when you need it.  We recommend signing up for the patient portal called "MyChart".  Sign up information is provided on this After Visit Summary.  MyChart is used to connect with patients for Virtual Visits (Telemedicine).   Patients are able to  view lab/test results, encounter notes, upcoming appointments, etc.  Non-urgent messages can be sent to your provider as well.   To learn more about what you can do with MyChart, go to [email or web address].    Your next appointment:   August 25th  The format for your next appointment:   In Person  Provider:   Lennie Odor, MD         Time Spent with Patient: I have spent a total of 35 minutes with patient reviewing hospital notes, telemetry, EKGs, labs and examining the patient as well as establishing an assessment and plan that was discussed with the patient.  > 50% of time was spent in direct patient care.  Signed, Lenna Gilford. Flora Lipps, MD [facility]  [address] [place], [place] [address] [phone]  09/16/2019 5:13 PM

## 2019-09-16 ENCOUNTER — Encounter: Payer: Self-pay | Admitting: Cardiovascular Disease

## 2019-09-16 ENCOUNTER — Telehealth: Payer: BC Managed Care – PPO | Admitting: General Practice

## 2019-09-16 ENCOUNTER — Ambulatory Visit (INDEPENDENT_AMBULATORY_CARE_PROVIDER_SITE_OTHER): Payer: BC Managed Care – PPO | Admitting: Cardiovascular Disease

## 2019-09-16 ENCOUNTER — Other Ambulatory Visit: Payer: Self-pay

## 2019-09-16 VITALS — BP 128/72 | HR 67 | Ht 66.0 in | Wt 175.8 lb

## 2019-09-16 DIAGNOSIS — I2510 Atherosclerotic heart disease of native coronary artery without angina pectoris: Secondary | ICD-10-CM

## 2019-09-16 DIAGNOSIS — E782 Mixed hyperlipidemia: Secondary | ICD-10-CM | POA: Diagnosis not present

## 2019-09-16 DIAGNOSIS — I10 Essential (primary) hypertension: Secondary | ICD-10-CM

## 2019-09-16 DIAGNOSIS — R600 Localized edema: Secondary | ICD-10-CM

## 2019-09-16 MED ORDER — HYDROCHLOROTHIAZIDE 25 MG PO TABS
25.0000 mg | ORAL_TABLET | Freq: Every day | ORAL | 3 refills | Status: DC
Start: 2019-09-16 — End: 2020-03-30

## 2019-09-16 NOTE — Patient Instructions (Signed)
Medication Instructions:  Stop Amlodipine  Increase HCTZ to 25 mg daily   *If you need a refill on your cardiac medications before your next appointment, please call your pharmacy*  Follow-Up: At [facility], you and your health needs are our priority.  As part of our continuing mission to provide you with exceptional heart care, we have created designated Provider Care Teams.  These Care Teams include your primary Cardiologist (physician) and Advanced Practice Providers (APPs -  Physician Assistants and Nurse Practitioners) who all work together to provide you with the care you need, when you need it.  We recommend signing up for the patient portal called "MyChart".  Sign up information is provided on this After Visit Summary.  MyChart is used to connect with patients for Virtual Visits (Telemedicine).  Patients are able to view lab/test results, encounter notes, upcoming appointments, etc.  Non-urgent messages can be sent to your provider as well.   To learn more about what you can do with MyChart, go to [email or web address].    Your next appointment:   August 25th  The format for your next appointment:   In Person  Provider:   Lennie Odor, MD

## 2019-10-08 LAB — LIPID PANEL
Chol/HDL Ratio: 3.4 ratio (ref 0.0–5.0)
Cholesterol, Total: 111 mg/dL (ref 100–199)
HDL: 33 mg/dL — ABNORMAL LOW (ref >39)
LDL Chol Calc (NIH): 65 mg/dL (ref 0–99)
Triglycerides: 55 mg/dL (ref 0–149)
VLDL Cholesterol Cal: 13 mg/dL (ref 5–40)

## 2019-10-09 ENCOUNTER — Other Ambulatory Visit: Payer: Self-pay

## 2019-10-09 MED ORDER — LISINOPRIL 20 MG PO TABS
20.0000 mg | ORAL_TABLET | Freq: Every day | ORAL | 3 refills | Status: DC
Start: 2019-10-09 — End: 2019-10-14

## 2019-10-12 NOTE — Progress Notes (Signed)
Cardiology Office Note:   Date:  10/14/2019  NAME:  Christian Baird    MRN: [id number] DOB:  Sep 27, 1955   PCP:  Wilburn Mylar, MD  Cardiologist:  Reatha Harps, MD   Referring MD: Wilburn Mylar, MD   Chief Complaint  Patient presents with  . Hypertension   History of Present Illness:   Christian Baird is a 64 y.o. male with a hx of CAD status post drug-eluting stent to RCA in setting of non-STEMI in June 2021, hypertension, hyperlipidemia presents for follow-up.  He presents for reevaluation of blood pressure.  Blood pressure has been a bit elevated lately.  We did come off amlodipine due to lower extremity edema.  He is back on lisinopril 20 mg daily.  He reports his blood pressure has systolically range 145-160.  Blood pressure today is 160/83.  He denies any chest pain or shortness of breath or palpitations.  He is walking 2.5 miles 2 days/week.  He has no limitations with this.  [place]  He has no issues completing 8-hour days on the job.  Overall doing well.  Need to get blood pressure but better under control.  Problem List 1. NSTEMI 07/29/2019 -DES to dRCA 2. HTN 3. HLD -T chol 111, HDL 33, LDL 65, triglycerides 55  Past Medical History: Past Medical History:  Diagnosis Date  . Coronary artery disease   . GERD (gastroesophageal reflux disease)   . Hypertension   . Hypothyroidism     Past Surgical History: Past Surgical History:  Procedure Laterality Date  . ABDOMINAL SURGERY    . CORONARY STENT INTERVENTION N/A 07/29/2019   Procedure: CORONARY STENT INTERVENTION;  Surgeon: Swaziland, Peter M, MD;  Location: [facility] INVASIVE CV LAB;  Service: Cardiovascular;  Laterality: N/A;  . FRACTURE SURGERY    . LEFT HEART CATH AND CORONARY ANGIOGRAPHY N/A 07/29/2019   Procedure: LEFT HEART CATH AND CORONARY ANGIOGRAPHY;  Surgeon: Swaziland, Peter M, MD;  Location: [facility] INVASIVE CV LAB;  Service: Cardiovascular;  Laterality: N/A;    Current Medications: Current  Meds  Medication Sig  . aspirin 81 MG EC tablet Take 1 tablet (81 mg total) by mouth daily.  Marland Kitchen atorvastatin (LIPITOR) 80 MG tablet Take 1 tablet (80 mg total) by mouth daily.  . hydrochlorothiazide (HYDRODIURIL) 25 MG tablet Take 1 tablet (25 mg total) by mouth daily.  Marland Kitchen levothyroxine (SYNTHROID) 75 MCG tablet Take 75 mcg by mouth daily.   Marland Kitchen lisinopril (ZESTRIL) 40 MG tablet Take 1 tablet (40 mg total) by mouth daily.  . metoprolol succinate (TOPROL-XL) 25 MG 24 hr tablet Take 1 tablet (25 mg total) by mouth daily.  . nitroGLYCERIN (NITROSTAT) 0.4 MG SL tablet Place 1 tablet (0.4 mg total) under the tongue every 5 (five) minutes x 3 doses as needed for chest pain.  . pantoprazole (PROTONIX) 40 MG tablet Take 40 mg by mouth daily.   . ticagrelor (BRILINTA) 90 MG TABS tablet Take 1 tablet (90 mg total) by mouth 2 (two) times daily.  . [DISCONTINUED] lisinopril (ZESTRIL) 20 MG tablet Take 1 tablet (20 mg total) by mouth daily.     Allergies:    Patient has no known allergies.   Social History: Social History   Socioeconomic History  . Marital status: Married    Spouse name: Not on file  . Number of children: Not on file  . Years of education: Not on file  . Highest education level: Not on  file  Occupational History  . Not on file  Tobacco Use  . Smoking status: Never Smoker  . Smokeless tobacco: Never Used  Vaping Use  . Vaping Use: Never used  Substance and Sexual Activity  . Alcohol use: Never    Alcohol/week: 0.0 standard drinks  . Drug use: Never  . Sexual activity: Not on file  Other Topics Concern  . Not on file  Social History Narrative  . Not on file   Social Determinants of Health   Financial Resource Strain:   . Difficulty of Paying Living Expenses: Not on file  Food Insecurity:   . Worried About Programme researcher, broadcasting/film/video in the Last Year: Not on file  . Ran Out of Food in the Last Year: Not on file  Transportation Needs:   . Lack of Transportation (Medical): Not  on file  . Lack of Transportation (Non-Medical): Not on file  Physical Activity:   . Days of Exercise per Week: Not on file  . Minutes of Exercise per Session: Not on file  Stress:   . Feeling of Stress : Not on file  Social Connections:   . Frequency of Communication with Friends and Family: Not on file  . Frequency of Social Gatherings with Friends and Family: Not on file  . Attends Religious Services: Not on file  . Active Member of Clubs or Organizations: Not on file  . Attends Banker Meetings: Not on file  . Marital Status: Not on file    Family History: The patient's family history includes Hypertension in his mother.  ROS:   All other ROS reviewed and negative. Pertinent positives noted in the HPI.     EKGs/Labs/Other Studies Reviewed:   The following studies were personally reviewed by me today:  LHC 07/29/2019 1. Single vessel obstructive CAD 2. Normal LV function 3. Normal LVEDP 4. Successful PCI of the distal RCA with DES x 1.  TTE 07/29/2019  1. Left ventricular ejection fraction, by estimation, is 55 to 60%. The  left ventricle has normal function. The left ventricle has no regional  wall motion abnormalities. Left ventricular diastolic parameters were  normal.  2. Right ventricular systolic function is normal. The right ventricular  size is normal. Tricuspid regurgitation signal is inadequate for assessing  PA pressure.  3. Left atrial size was mildly dilated.  4. The mitral valve is normal in structure. Trivial mitral valve  regurgitation. No evidence of mitral stenosis.  5. The aortic valve is tricuspid. Aortic valve regurgitation is not  visualized. No aortic stenosis is present.  6. There is a linear echodensity in the ascending aorta. This may be  artifact, but dissection cannot be excluded.. Aortic abnormal.  7. The inferior vena cava is normal in size with greater than 50%  respiratory variability, suggesting right atrial pressure  of 3 mmHg.   Recent Labs: 07/29/2019: TSH 2.618 07/30/2019: BUN 20; Creatinine, Ser 1.41; Hemoglobin 14.9; Platelets 247; Potassium 3.6; Sodium 142   Recent Lipid Panel    Component Value Date/Time   CHOL 111 10/08/2019 0843   TRIG 55 10/08/2019 0843   HDL 33 (L) 10/08/2019 0843   CHOLHDL 3.4 10/08/2019 0843   CHOLHDL 4.4 07/29/2019 0512   VLDL 12 07/29/2019 0512   LDLCALC 65 10/08/2019 0843    Physical Exam:   VS:  BP (!) 160/82   Pulse (!) 56   Ht 5\' 6"  (1.676 m)   Wt 177 lb (80.3 kg)   BMI 28.57  kg/m    Wt Readings from Last 3 Encounters:  10/14/19 177 lb (80.3 kg)  09/16/19 175 lb 12.8 oz (79.7 kg)  08/11/19 176 lb (79.8 kg)    General: Well nourished, well developed, in no acute distress Heart: Atraumatic, normal size  Eyes: PEERLA, EOMI  Neck: Supple, no JVD Endocrine: No thryomegaly Cardiac: Normal S1, S2; RRR; no murmurs, rubs, or gallops Lungs: Clear to auscultation bilaterally, no wheezing, rhonchi or rales  Abd: Soft, nontender, no hepatomegaly  Ext: No edema, pulses 2+ Musculoskeletal: No deformities, BUE and BLE strength normal and equal Skin: Warm and dry, no rashes   Neuro: Alert and oriented to person, place, time, and situation, CNII-XII grossly intact, no focal deficits  Psych: Normal mood and affect   ASSESSMENT:   Christian Baird is a 64 y.o. male who presents for the following: 1. Coronary artery disease involving native coronary artery of native heart without angina pectoris   2. Mixed hyperlipidemia   3. Renovascular hypertension     PLAN:   1. Coronary artery disease involving native coronary artery of native heart without angina pectoris -Non-STEMI several months ago.  Drug-eluting stent to distal RCA.  Doing well.  No angina.  He will complete 1 year of DAPT. -Continue statin.  Most recent LDL at goal. -No symptoms of angina.  2. Mixed hyperlipidemia -Doing well.  LDL at goal.  Continue current medications.  3. Renovascular  hypertension -Blood pressure still up.  Did not tolerate amlodipine.  Increase lisinopril to 40 mg daily.  Continue HCTZ 25 mg daily.  On metoprolol 25 mg daily.  We will likely add Aldactone if blood pressure not at goal.  Disposition: Return in about 6 months (around 04/15/2020).  Medication Adjustments/Labs and Tests Ordered: Current medicines are reviewed at length with the patient today.  Concerns regarding medicines are outlined above.  No orders of the defined types were placed in this encounter.  Meds ordered this encounter  Medications  . lisinopril (ZESTRIL) 40 MG tablet    Sig: Take 1 tablet (40 mg total) by mouth daily.    Dispense:  90 tablet    Refill:  1    Patient Instructions  Medication Instructions:  Increase Lisinopril 40 mg daily   *If you need a refill on your cardiac medications before your next appointment, please call your pharmacy*  Follow-Up: At [facility], you and your health needs are our priority.  As part of our continuing mission to provide you with exceptional heart care, we have created designated Provider Care Teams.  These Care Teams include your primary Cardiologist (physician) and Advanced Practice Providers (APPs -  Physician Assistants and Nurse Practitioners) who all work together to provide you with the care you need, when you need it.  We recommend signing up for the patient portal called "MyChart".  Sign up information is provided on this After Visit Summary.  MyChart is used to connect with patients for Virtual Visits (Telemedicine).  Patients are able to view lab/test results, encounter notes, upcoming appointments, etc.  Non-urgent messages can be sent to your provider as well.   To learn more about what you can do with MyChart, go to [email or web address].    Your next appointment:   6 month(s)  The format for your next appointment:   In Person  Provider:   Lennie Odor, MD        Time Spent with Patient: I have spent a  total of 25 minutes with patient reviewing hospital  notes, telemetry, EKGs, labs and examining the patient as well as establishing an assessment and plan that was discussed with the patient.  > 50% of time was spent in direct patient care.  Signed, Lenna GilfordWesley T. Flora Lipps'Neal, MD [facility]  [facility]  [address] [place], [place] [id number] [phone]  10/14/2019 10:02 AM

## 2019-10-14 ENCOUNTER — Encounter: Payer: Self-pay | Admitting: Cardiovascular Disease

## 2019-10-14 ENCOUNTER — Ambulatory Visit (INDEPENDENT_AMBULATORY_CARE_PROVIDER_SITE_OTHER): Payer: BC Managed Care – PPO | Admitting: Cardiovascular Disease

## 2019-10-14 ENCOUNTER — Other Ambulatory Visit: Payer: Self-pay

## 2019-10-14 VITALS — BP 160/82 | HR 56 | Ht 66.0 in | Wt 177.0 lb

## 2019-10-14 DIAGNOSIS — E782 Mixed hyperlipidemia: Secondary | ICD-10-CM | POA: Diagnosis not present

## 2019-10-14 DIAGNOSIS — I150 Renovascular hypertension: Secondary | ICD-10-CM | POA: Diagnosis not present

## 2019-10-14 DIAGNOSIS — I2510 Atherosclerotic heart disease of native coronary artery without angina pectoris: Secondary | ICD-10-CM

## 2019-10-14 MED ORDER — LISINOPRIL 40 MG PO TABS
40.0000 mg | ORAL_TABLET | Freq: Every day | ORAL | 1 refills | Status: DC
Start: 2019-10-14 — End: 2020-03-30

## 2019-10-14 NOTE — Patient Instructions (Signed)
Medication Instructions:  Increase Lisinopril 40 mg daily   *If you need a refill on your cardiac medications before your next appointment, please call your pharmacy*  Follow-Up: At [facility], you and your health needs are our priority.  As part of our continuing mission to provide you with exceptional heart care, we have created designated Provider Care Teams.  These Care Teams include your primary Cardiologist (physician) and Advanced Practice Providers (APPs -  Physician Assistants and Nurse Practitioners) who all work together to provide you with the care you need, when you need it.  We recommend signing up for the patient portal called "MyChart".  Sign up information is provided on this After Visit Summary.  MyChart is used to connect with patients for Virtual Visits (Telemedicine).  Patients are able to view lab/test results, encounter notes, upcoming appointments, etc.  Non-urgent messages can be sent to your provider as well.   To learn more about what you can do with MyChart, go to [email or web address].    Your next appointment:   6 month(s)  The format for your next appointment:   In Person  Provider:   Lennie Odor, MD

## 2019-10-21 MED ORDER — METOPROLOL SUCCINATE ER 50 MG PO TB24: 50.0000 mg | ORAL_TABLET | Freq: Every day | ORAL | 1 refills | Status: DC

## 2019-10-28 ENCOUNTER — Other Ambulatory Visit: Payer: Self-pay

## 2019-10-28 MED ORDER — SPIRONOLACTONE 25 MG PO TABS
12.5000 mg | ORAL_TABLET | Freq: Every day | ORAL | 2 refills | Status: DC
Start: 2019-10-28 — End: 2020-03-30

## 2020-01-05 ENCOUNTER — Other Ambulatory Visit: Payer: Self-pay | Admitting: Cardiovascular Disease

## 2020-01-06 ENCOUNTER — Other Ambulatory Visit: Payer: Self-pay | Admitting: *Deleted

## 2020-01-06 MED ORDER — TICAGRELOR 90 MG PO TABS: 90.0000 mg | ORAL_TABLET | Freq: Two times a day (BID) | ORAL | 2 refills | Status: DC

## 2020-02-24 MED ORDER — PANTOPRAZOLE SODIUM 40 MG PO TBEC: 40.0000 mg | DELAYED_RELEASE_TABLET | Freq: Every day | ORAL | 1 refills | Status: DC

## 2020-03-12 NOTE — Progress Notes (Deleted)
Cardiology Office Note:   Date:  03/12/2020  NAME:  Christian Baird    MRN: [id number] DOB:  [birth year]   PCP:  Wilburn Mylar, MD  Cardiologist:  Reatha Harps, MD  Electrophysiologist:  None   Referring MD: Wilburn Mylar, MD   No chief complaint on file. ***   History of Present Illness:   Christian Baird is a 65 y.o. male with a hx of CAD s/p DES to dRCA, HTN, HLD who presents for follow-up.  Problem List 1. NSTEMI 07/29/2019 -DES to dRCA 2. HTN 3. HLD -T chol 111, HDL 33, LDL 65, triglycerides 55  Past Medical History: Past Medical History:  Diagnosis Date  . Coronary artery disease   . GERD (gastroesophageal reflux disease)   . Hypertension   . Hypothyroidism     Past Surgical History: Past Surgical History:  Procedure Laterality Date  . ABDOMINAL SURGERY    . CORONARY STENT INTERVENTION N/A 07/29/2019   Procedure: CORONARY STENT INTERVENTION;  Surgeon: Swaziland, Peter M, MD;  Location: [facility] INVASIVE CV LAB;  Service: Cardiovascular;  Laterality: N/A;  . FRACTURE SURGERY    . LEFT HEART CATH AND CORONARY ANGIOGRAPHY N/A 07/29/2019   Procedure: LEFT HEART CATH AND CORONARY ANGIOGRAPHY;  Surgeon: Swaziland, Peter M, MD;  Location: [facility] INVASIVE CV LAB;  Service: Cardiovascular;  Laterality: N/A;    Current Medications: No outpatient medications have been marked as taking for the 03/16/20 encounter (Appointment) with O'Neal, Ronnald Ramp, MD.     Allergies:    Patient has no known allergies.   Social History: Social History   Socioeconomic History  . Marital status: Married    Spouse name: Not on file  . Number of children: Not on file  . Years of education: Not on file  . Highest education level: Not on file  Occupational History  . Not on file  Tobacco Use  . Smoking status: Never Smoker  . Smokeless tobacco: Never Used  Vaping Use  . Vaping Use: Never used  Substance and Sexual Activity  . Alcohol use: Never    Alcohol/week: 0.0 standard drinks  . Drug  use: Never  . Sexual activity: Not on file  Other Topics Concern  . Not on file  Social History Narrative  . Not on file   Social Determinants of Health   Financial Resource Strain: Not on file  Food Insecurity: Not on file  Transportation Needs: Not on file  Physical Activity: Not on file  Stress: Not on file  Social Connections: Not on file     Family History: The patient's ***family history includes Hypertension in his mother.  ROS:   All other ROS reviewed and negative. Pertinent positives noted in the HPI.     EKGs/Labs/Other Studies Reviewed:   The following studies were personally reviewed by me today:  EKG:  EKG is *** ordered today.  The ekg ordered today demonstrates ***, and was personally reviewed by me.   LHC 07/29/2019   Prox LAD to Mid LAD lesion is 30% stenosed.  Dist RCA lesion is 95% stenosed.  A drug-eluting stent was successfully placed using a STENT RESOLUTE ONYX [id number].  Post intervention, there is a 0% residual stenosis.  Prox RCA to Mid RCA lesion is 20% stenosed.  The left ventricular systolic function is normal.  LV end diastolic pressure is normal.  The left ventricular ejection fraction is 55-65% by visual estimate.   1. Single vessel obstructive CAD 2. Normal LV function 3.  Normal LVEDP 4. Successful PCI of the distal RCA with DES x 1.   TTE 07/29/2019  1. Left ventricular ejection fraction, by estimation, is 55 to 60%. The  left ventricle has normal function. The left ventricle has no regional  wall motion abnormalities. Left ventricular diastolic parameters were  normal.  2. Right ventricular systolic function is normal. The right ventricular  size is normal. Tricuspid regurgitation signal is inadequate for assessing  PA pressure.  3. Left atrial size was mildly dilated.  4. The mitral valve is normal in structure. Trivial mitral valve  regurgitation. No evidence of mitral stenosis.  5. The aortic valve is tricuspid.  Aortic valve regurgitation is not  visualized. No aortic stenosis is present.  6. There is a linear echodensity in the ascending aorta. This may be  artifact, but dissection cannot be excluded.. Aortic abnormal.  7. The inferior vena cava is normal in size with greater than 50%  respiratory variability, suggesting right atrial pressure of 3 mmHg.     Recent Labs: 07/29/2019: TSH 2.618 07/30/2019: BUN 20; Creatinine, Ser 1.41; Hemoglobin 14.9; Platelets 247; Potassium 3.6; Sodium 142   Recent Lipid Panel    Component Value Date/Time   CHOL 111 10/08/2019 0843   TRIG 55 10/08/2019 0843   HDL 33 (L) 10/08/2019 0843   CHOLHDL 3.4 10/08/2019 0843   CHOLHDL 4.4 07/29/2019 0512   VLDL 12 07/29/2019 0512   LDLCALC 65 10/08/2019 0843    Physical Exam:   VS:  There were no vitals taken for this visit.   Wt Readings from Last 3 Encounters:  10/14/19 177 lb (80.3 kg)  09/16/19 175 lb 12.8 oz (79.7 kg)  08/11/19 176 lb (79.8 kg)    General: Well nourished, well developed, in no acute distress Head: Atraumatic, normal size  Eyes: PEERLA, EOMI  Neck: Supple, no JVD Endocrine: No thryomegaly Cardiac: Normal S1, S2; RRR; no murmurs, rubs, or gallops Lungs: Clear to auscultation bilaterally, no wheezing, rhonchi or rales  Abd: Soft, nontender, no hepatomegaly  Ext: No edema, pulses 2+ Musculoskeletal: No deformities, BUE and BLE strength normal and equal Skin: Warm and dry, no rashes   Neuro: Alert and oriented to person, place, time, and situation, CNII-XII grossly intact, no focal deficits  Psych: Normal mood and affect   ASSESSMENT:   Christian Baird is a 65 y.o. male who presents for the following: No diagnosis found.  PLAN:   There are no diagnoses linked to this encounter.  Disposition: No follow-ups on file.  Medication Adjustments/Labs and Tests Ordered: Current medicines are reviewed at length with the patient today.  Concerns regarding medicines are outlined above.  No  orders of the defined types were placed in this encounter.  No orders of the defined types were placed in this encounter.   There are no Patient Instructions on file for this visit.   Time Spent with Patient: I have spent a total of *** minutes with patient reviewing hospital notes, telemetry, EKGs, labs and examining the patient as well as establishing an assessment and plan that was discussed with the patient.  > 50% of time was spent in direct patient care.  Signed, Lenna Gilford. Flora Lipps, MD [facility]  [address] [place], [place] [address] [phone]  03/12/2020 6:28 PM

## 2020-03-16 ENCOUNTER — Ambulatory Visit: Payer: BC Managed Care – PPO | Admitting: Cardiovascular Disease

## 2020-03-16 DIAGNOSIS — I2510 Atherosclerotic heart disease of native coronary artery without angina pectoris: Secondary | ICD-10-CM

## 2020-03-16 DIAGNOSIS — E782 Mixed hyperlipidemia: Secondary | ICD-10-CM

## 2020-03-16 DIAGNOSIS — I10 Essential (primary) hypertension: Secondary | ICD-10-CM

## 2020-03-29 ENCOUNTER — Ambulatory Visit: Payer: BC Managed Care – PPO | Admitting: Cardiovascular Disease

## 2020-03-29 NOTE — Progress Notes (Signed)
Cardiology Office Note:   Date:  03/30/2020  NAME:  Christian Baird    MRN: [id number] DOB:  01-Oct-1955   PCP:  Wilburn Mylar, MD  Cardiologist:  Reatha Harps, MD   Referring MD: Wilburn Mylar, MD   Chief Complaint  Patient presents with  . Coronary Artery Disease   History of Present Illness:   Christian Baird is a 65 y.o. male with a hx of CAD, hypertension, hyperlipidemia presents for follow-up.  He reports he is doing well.  No chest pain or shortness of breath.  He is not exercising.  [place]  He gets 5000 steps per day.  No chest pain or shortness of breath with this.  BP 136/72.  Remains on current medications.  He does need a primary care physician.  He reports intermittent anxiety.  I recommended Atarax.  He is okay to try this.  His most recent lipid profile is at goal.  He needs to establish with primary care physician for yearly labs.  If not we can do this.  No bleeding on aspirin and Brilinta.  Doing well.  Problem List 1. NSTEMI 07/29/2019 -DES to dRCA 2. HTN 3. HLD -T chol 111, HDL 33, LDL 65, triglycerides 55  Past Medical History: Past Medical History:  Diagnosis Date  . Coronary artery disease   . GERD (gastroesophageal reflux disease)   . Hypertension   . Hypothyroidism     Past Surgical History: Past Surgical History:  Procedure Laterality Date  . ABDOMINAL SURGERY    . CORONARY STENT INTERVENTION N/A 07/29/2019   Procedure: CORONARY STENT INTERVENTION;  Surgeon: Swaziland, Peter M, MD;  Location: [facility] INVASIVE CV LAB;  Service: Cardiovascular;  Laterality: N/A;  . FRACTURE SURGERY    . LEFT HEART CATH AND CORONARY ANGIOGRAPHY N/A 07/29/2019   Procedure: LEFT HEART CATH AND CORONARY ANGIOGRAPHY;  Surgeon: Swaziland, Peter M, MD;  Location: [facility] INVASIVE CV LAB;  Service: Cardiovascular;  Laterality: N/A;    Current Medications: Current Meds  Medication Sig  . hydrOXYzine (ATARAX/VISTARIL) 10 MG tablet Take 1 tablet (10 mg total) by  mouth daily as needed.  . nitroGLYCERIN (NITROSTAT) 0.4 MG SL tablet Place 1 tablet (0.4 mg total) under the tongue every 5 (five) minutes x 3 doses as needed for chest pain.  . pantoprazole (PROTONIX) 40 MG tablet Take 1 tablet (40 mg total) by mouth daily.  . [DISCONTINUED] aspirin 81 MG EC tablet Take 1 tablet (81 mg total) by mouth daily.  . [DISCONTINUED] atorvastatin (LIPITOR) 80 MG tablet Take 1 tablet (80 mg total) by mouth daily.  . [DISCONTINUED] lisinopril (ZESTRIL) 40 MG tablet Take 1 tablet (40 mg total) by mouth daily.  . [DISCONTINUED] metoprolol succinate (TOPROL-XL) 50 MG 24 hr tablet Take 1 tablet (50 mg total) by mouth daily.  . [DISCONTINUED] ticagrelor (BRILINTA) 90 MG TABS tablet Take 1 tablet (90 mg total) by mouth 2 (two) times daily.     Allergies:    Patient has no known allergies.   Social History: Social History   Socioeconomic History  . Marital status: Married    Spouse name: Not on file  . Number of children: Not on file  . Years of education: Not on file  . Highest education level: Not on file  Occupational History  . Not on file  Tobacco Use  . Smoking status: Never Smoker  . Smokeless tobacco: Never Used  Vaping Use  . Vaping Use: Never  used  Substance and Sexual Activity  . Alcohol use: Never    Alcohol/week: 0.0 standard drinks  . Drug use: Never  . Sexual activity: Not on file  Other Topics Concern  . Not on file  Social History Narrative  . Not on file   Social Determinants of Health   Financial Resource Strain: Not on file  Food Insecurity: Not on file  Transportation Needs: Not on file  Physical Activity: Not on file  Stress: Not on file  Social Connections: Not on file     Family History: The patient's family history includes Hypertension in his mother.  ROS:   All other ROS reviewed and negative. Pertinent positives noted in the HPI.     EKGs/Labs/Other Studies Reviewed:   The following studies were personally reviewed  by me today:   LHC 07/29/2019   Prox LAD to Mid LAD lesion is 30% stenosed.  Dist RCA lesion is 95% stenosed.  A drug-eluting stent was successfully placed using a STENT RESOLUTE ONYX [id number].  Post intervention, there is a 0% residual stenosis.  Prox RCA to Mid RCA lesion is 20% stenosed.  The left ventricular systolic function is normal.  LV end diastolic pressure is normal.  The left ventricular ejection fraction is 55-65% by visual estimate.   1. Single vessel obstructive CAD 2. Normal LV function 3. Normal LVEDP 4. Successful PCI of the distal RCA with DES x 1.  TTE 07/29/2019 1. Left ventricular ejection fraction, by estimation, is 55 to 60%. The  left ventricle has normal function. The left ventricle has no regional  wall motion abnormalities. Left ventricular diastolic parameters were  normal.  2. Right ventricular systolic function is normal. The right ventricular  size is normal. Tricuspid regurgitation signal is inadequate for assessing  PA pressure.  3. Left atrial size was mildly dilated.  4. The mitral valve is normal in structure. Trivial mitral valve  regurgitation. No evidence of mitral stenosis.  5. The aortic valve is tricuspid. Aortic valve regurgitation is not  visualized. No aortic stenosis is present.  6. There is a linear echodensity in the ascending aorta. This may be  artifact, but dissection cannot be excluded.. Aortic abnormal.  7. The inferior vena cava is normal in size with greater than 50%  respiratory variability, suggesting right atrial pressure of 3 mmHg.   Recent Labs: 07/29/2019: TSH 2.618 07/30/2019: BUN 20; Creatinine, Ser 1.41; Hemoglobin 14.9; Platelets 247; Potassium 3.6; Sodium 142   Recent Lipid Panel    Component Value Date/Time   CHOL 111 10/08/2019 0843   TRIG 55 10/08/2019 0843   HDL 33 (L) 10/08/2019 0843   CHOLHDL 3.4 10/08/2019 0843   CHOLHDL 4.4 07/29/2019 0512   VLDL 12 07/29/2019 0512   LDLCALC 65  10/08/2019 0843    Physical Exam:   VS:  BP 136/72   Pulse (!) 59   Ht 5\' 6"  (1.676 m)   Wt 179 lb (81.2 kg)   SpO2 99%   BMI 28.89 kg/m    Wt Readings from Last 3 Encounters:  03/30/20 179 lb (81.2 kg)  10/14/19 177 lb (80.3 kg)  09/16/19 175 lb 12.8 oz (79.7 kg)    General: Well nourished, well developed, in no acute distress Head: Atraumatic, normal size  Eyes: PEERLA, EOMI  Neck: Supple, no JVD Endocrine: No thryomegaly Cardiac: Normal S1, S2; RRR; no murmurs, rubs, or gallops Lungs: Clear to auscultation bilaterally, no wheezing, rhonchi or rales  Abd: Soft, nontender, no hepatomegaly  Ext: No edema, pulses 2+ Musculoskeletal: No deformities, BUE and BLE strength normal and equal Skin: Warm and dry, no rashes   Neuro: Alert and oriented to person, place, time, and situation, CNII-XII grossly intact, no focal deficits  Psych: Normal mood and affect   ASSESSMENT:   Christian Baird is a 65 y.o. male who presents for the following: 1. Coronary artery disease involving native coronary artery of native heart without angina pectoris   2. Mixed hyperlipidemia   3. Essential hypertension   4. Anxiety     PLAN:   1. Coronary artery disease involving native coronary artery of native heart without angina pectoris 2. Mixed hyperlipidemia -NSTEMI 07/29/2019 -DES to dRCA -Doing well no chest pain.  Continue DAPT through July 28, 2020.  He will then resume aspirin.  I will see him back in 4 months to discuss this. -Continue high intensity statin.  Most recent LDL at goal.  Needs yearly labs through PCP. -BP at goal.  3. Essential hypertension -Refill metoprolol succinate 50 mg daily, Aldactone 12.5 mg daily, lisinopril 40 mg daily, HCTZ 25 mg daily.  Salt advised.  4. Anxiety -Reports intermittent anxiety.  Will try Atarax 10 mg daily as needed.  Also needs a PCP.  Disposition: Return in about 4 months (around 07/28/2020).  Medication Adjustments/Labs and Tests  Ordered: Current medicines are reviewed at length with the patient today.  Concerns regarding medicines are outlined above.  No orders of the defined types were placed in this encounter.  Meds ordered this encounter  Medications  . ticagrelor (BRILINTA) 90 MG TABS tablet    Sig: Take 1 tablet (90 mg total) by mouth 2 (two) times daily.    Dispense:  180 tablet    Refill:  2  . spironolactone (ALDACTONE) 25 MG tablet    Sig: Take 0.5 tablets (12.5 mg total) by mouth daily.    Dispense:  30 tablet    Refill:  2  . metoprolol succinate (TOPROL-XL) 50 MG 24 hr tablet    Sig: Take 1 tablet (50 mg total) by mouth daily.    Dispense:  90 tablet    Refill:  1  . lisinopril (ZESTRIL) 40 MG tablet    Sig: Take 1 tablet (40 mg total) by mouth daily.    Dispense:  90 tablet    Refill:  1  . hydrochlorothiazide (HYDRODIURIL) 25 MG tablet    Sig: Take 1 tablet (25 mg total) by mouth daily.    Dispense:  90 tablet    Refill:  3  . atorvastatin (LIPITOR) 80 MG tablet    Sig: Take 1 tablet (80 mg total) by mouth daily.    Dispense:  60 tablet    Refill:  6  . aspirin 81 MG EC tablet    Sig: Take 1 tablet (81 mg total) by mouth daily.    Dispense:  90 tablet    Refill:  3  . hydrOXYzine (ATARAX/VISTARIL) 10 MG tablet    Sig: Take 1 tablet (10 mg total) by mouth daily as needed.    Dispense:  30 tablet    Refill:  1    Patient Instructions  Medication Instructions:  Start Hydroxyzine 10 mg daily  *If you need a refill on your cardiac medications before your next appointment, please call your pharmacy*  Follow-Up: At [facility], you and your health needs are our priority.  As part of our continuing mission to provide you with exceptional heart care, we have created  designated Provider Care Teams.  These Care Teams include your primary Cardiologist (physician) and Advanced Practice Providers (APPs -  Physician Assistants and Nurse Practitioners) who all work together to provide you with  the care you need, when you need it.  We recommend signing up for the patient portal called "MyChart".  Sign up information is provided on this After Visit Summary.  MyChart is used to connect with patients for Virtual Visits (Telemedicine).  Patients are able to view lab/test results, encounter notes, upcoming appointments, etc.  Non-urgent messages can be sent to your provider as well.   To learn more about what you can do with MyChart, go to [email or web address].    Your next appointment:   4 month(s)  The format for your next appointment:   In Person  Provider:   Lennie Odor, MD   PCP information: Brayton Mars- Dr.Greene     Time Spent with Patient: I have spent a total of 25 minutes with patient reviewing hospital notes, telemetry, EKGs, labs and examining the patient as well as establishing an assessment and plan that was discussed with the patient.  > 50% of time was spent in direct patient care.  Signed, Lenna Gilford. Flora Lipps, MD [facility]  [address] [place], [place] [address] [phone]  03/30/2020 10:22 AM

## 2020-03-30 ENCOUNTER — Ambulatory Visit (INDEPENDENT_AMBULATORY_CARE_PROVIDER_SITE_OTHER): Payer: BC Managed Care – PPO | Admitting: Cardiovascular Disease

## 2020-03-30 ENCOUNTER — Encounter: Payer: Self-pay | Admitting: Cardiovascular Disease

## 2020-03-30 ENCOUNTER — Other Ambulatory Visit: Payer: Self-pay

## 2020-03-30 VITALS — BP 136/72 | HR 59 | Ht 66.0 in | Wt 179.0 lb

## 2020-03-30 DIAGNOSIS — F419 Anxiety disorder, unspecified: Secondary | ICD-10-CM | POA: Diagnosis not present

## 2020-03-30 DIAGNOSIS — E782 Mixed hyperlipidemia: Secondary | ICD-10-CM | POA: Diagnosis not present

## 2020-03-30 DIAGNOSIS — I2510 Atherosclerotic heart disease of native coronary artery without angina pectoris: Secondary | ICD-10-CM | POA: Diagnosis not present

## 2020-03-30 DIAGNOSIS — I10 Essential (primary) hypertension: Secondary | ICD-10-CM

## 2020-03-30 MED ORDER — LISINOPRIL 40 MG PO TABS: 40.0000 mg | ORAL_TABLET | Freq: Every day | ORAL | 1 refills | Status: DC

## 2020-03-30 MED ORDER — ATORVASTATIN CALCIUM 80 MG PO TABS: 80.0000 mg | ORAL_TABLET | Freq: Every day | ORAL | 6 refills | Status: DC

## 2020-03-30 MED ORDER — TICAGRELOR 90 MG PO TABS: 90.0000 mg | ORAL_TABLET | Freq: Two times a day (BID) | ORAL | 2 refills | Status: DC

## 2020-03-30 MED ORDER — HYDROXYZINE HCL 10 MG PO TABS
10.0000 mg | ORAL_TABLET | Freq: Every day | ORAL | 1 refills | Status: DC | PRN
Start: 2020-03-30 — End: 2020-06-07

## 2020-03-30 MED ORDER — SPIRONOLACTONE 25 MG PO TABS: 12.5000 mg | ORAL_TABLET | Freq: Every day | ORAL | 2 refills | Status: DC

## 2020-03-30 MED ORDER — METOPROLOL SUCCINATE ER 50 MG PO TB24: 50.0000 mg | ORAL_TABLET | Freq: Every day | ORAL | 1 refills | Status: DC

## 2020-03-30 MED ORDER — ASPIRIN 81 MG PO TBEC: 81.0000 mg | DELAYED_RELEASE_TABLET | Freq: Every day | ORAL | 3 refills | Status: DC

## 2020-03-30 MED ORDER — HYDROCHLOROTHIAZIDE 25 MG PO TABS: 25.0000 mg | ORAL_TABLET | Freq: Every day | ORAL | 3 refills | Status: DC

## 2020-03-30 NOTE — Patient Instructions (Addendum)
Medication Instructions:  Start Hydroxyzine 10 mg daily  *If you need a refill on your cardiac medications before your next appointment, please call your pharmacy*  Follow-Up: At [facility], you and your health needs are our priority.  As part of our continuing mission to provide you with exceptional heart care, we have created designated Provider Care Teams.  These Care Teams include your primary Cardiologist (physician) and Advanced Practice Providers (APPs -  Physician Assistants and Nurse Practitioners) who all work together to provide you with the care you need, when you need it.  We recommend signing up for the patient portal called "MyChart".  Sign up information is provided on this After Visit Summary.  MyChart is used to connect with patients for Virtual Visits (Telemedicine).  Patients are able to view lab/test results, encounter notes, upcoming appointments, etc.  Non-urgent messages can be sent to your provider as well.   To learn more about what you can do with MyChart, go to [email or web address].    Your next appointment:   4 month(s)  The format for your next appointment:   In Person  Provider:   Lennie Odor, MD   PCP information: Brayton Mars- Dr.Greene

## 2020-05-02 ENCOUNTER — Other Ambulatory Visit: Payer: Self-pay | Admitting: Cardiovascular Disease

## 2020-05-04 ENCOUNTER — Telehealth: Payer: Self-pay

## 2020-05-04 NOTE — Telephone Encounter (Signed)
You refilling this, or go to PCP? Thanks!

## 2020-05-04 NOTE — Telephone Encounter (Signed)
Hey can you help me with this PA for Brilinta 90 mg tablets.  Thank you!   Key: [id number] Last name: Madlock DOB: Mar 26, 1955

## 2020-05-05 NOTE — Telephone Encounter (Signed)
I did a Brilinta PA through covermyeds and received the following message: Christian Baird Key: [id number] - Rx #: [id number] Need help? Call us at [phone] Outcome: [facility] has indicated that it is too soon to refill this medication at the pharmacy for your patient.  Drug: Brilinta 90MG  tablets Form: PA Form (2017 NCPDP) Original Claim Info: 79 MAXIMUM DAYS SUPPLY OF 30  I called [facility] pharmacy and was advised that the pts Brilinta refill is ready for pickup at the normal Cost of $25/30 day supply and that a PA is not required at this time.

## 2020-06-04 ENCOUNTER — Encounter: Payer: Self-pay | Admitting: Family Medicine

## 2020-06-06 ENCOUNTER — Other Ambulatory Visit: Payer: Self-pay

## 2020-06-07 ENCOUNTER — Encounter: Payer: Self-pay | Admitting: Family Medicine

## 2020-06-07 ENCOUNTER — Ambulatory Visit (INDEPENDENT_AMBULATORY_CARE_PROVIDER_SITE_OTHER): Payer: BC Managed Care – PPO | Admitting: Primary Care

## 2020-06-07 ENCOUNTER — Ambulatory Visit: Payer: BC Managed Care – PPO | Admitting: Family Medicine

## 2020-06-07 VITALS — BP 142/80 | HR 51 | Temp 97.5°F | Ht 66.0 in | Wt 175.8 lb

## 2020-06-07 DIAGNOSIS — Z125 Encounter for screening for malignant neoplasm of prostate: Secondary | ICD-10-CM

## 2020-06-07 DIAGNOSIS — Z0000 Encounter for general adult medical examination without abnormal findings: Secondary | ICD-10-CM | POA: Insufficient documentation

## 2020-06-07 DIAGNOSIS — I10 Essential (primary) hypertension: Secondary | ICD-10-CM

## 2020-06-07 DIAGNOSIS — M7712 Lateral epicondylitis, left elbow: Secondary | ICD-10-CM

## 2020-06-07 DIAGNOSIS — E039 Hypothyroidism, unspecified: Secondary | ICD-10-CM

## 2020-06-07 DIAGNOSIS — M722 Plantar fascial fibromatosis: Secondary | ICD-10-CM

## 2020-06-07 DIAGNOSIS — I2583 Coronary atherosclerosis due to lipid rich plaque: Secondary | ICD-10-CM | POA: Diagnosis not present

## 2020-06-07 DIAGNOSIS — F419 Anxiety disorder, unspecified: Secondary | ICD-10-CM | POA: Diagnosis not present

## 2020-06-07 DIAGNOSIS — K219 Gastro-esophageal reflux disease without esophagitis: Secondary | ICD-10-CM

## 2020-06-07 DIAGNOSIS — I2510 Atherosclerotic heart disease of native coronary artery without angina pectoris: Secondary | ICD-10-CM | POA: Diagnosis not present

## 2020-06-07 LAB — CBC
HCT: 41.8 % (ref 39.0–52.0)
Hemoglobin: 14.3 g/dL (ref 13.0–17.0)
MCHC: 34.3 g/dL (ref 30.0–36.0)
MCV: 90 fl (ref 78.0–100.0)
Platelets: 241 10*3/uL (ref 150.0–400.0)
RBC: 4.64 Mil/uL (ref 4.22–5.81)
RDW: 13.8 % (ref 11.5–15.5)
WBC: 8.9 10*3/uL (ref 4.0–10.5)

## 2020-06-07 LAB — COMPREHENSIVE METABOLIC PANEL
ALT: 62 U/L — ABNORMAL HIGH (ref 0–53)
AST: 36 U/L (ref 0–37)
Albumin: 4.4 g/dL (ref 3.5–5.2)
Alkaline Phosphatase: 45 U/L (ref 39–117)
BUN: 39 mg/dL — ABNORMAL HIGH (ref 6–23)
CO2: 28 mEq/L (ref 19–32)
Calcium: 9.4 mg/dL (ref 8.4–10.5)
Chloride: 102 mEq/L (ref 96–112)
Creatinine, Ser: 1.42 mg/dL (ref 0.40–1.50)
GFR: 52.88 mL/min — ABNORMAL LOW (ref >60.00)
Glucose, Bld: 85 mg/dL (ref 70–99)
Potassium: 3.7 mEq/L (ref 3.5–5.1)
Sodium: 139 mEq/L (ref 135–145)
Total Bilirubin: 1 mg/dL (ref 0.2–1.2)
Total Protein: 7.4 g/dL (ref 6.0–8.3)

## 2020-06-07 LAB — URINALYSIS, ROUTINE W REFLEX MICROSCOPIC
Bilirubin Urine: NEGATIVE
Hgb urine dipstick: NEGATIVE
Ketones, ur: NEGATIVE
Leukocytes,Ua: NEGATIVE
Nitrite: NEGATIVE
RBC / HPF: NONE SEEN (ref 0–?)
Specific Gravity, Urine: 1.015 (ref 1.000–1.030)
Total Protein, Urine: NEGATIVE
Urine Glucose: NEGATIVE
Urobilinogen, UA: 0.2 (ref 0.0–1.0)
WBC, UA: NONE SEEN (ref 0–?)
pH: 5.5 (ref 5.0–8.0)

## 2020-06-07 LAB — LIPID PANEL
Cholesterol: 93 mg/dL (ref 0–200)
HDL: 35.9 mg/dL — ABNORMAL LOW (ref >39.00)
LDL Cholesterol: 48 mg/dL (ref 0–99)
NonHDL: 56.69
Total CHOL/HDL Ratio: 3
Triglycerides: 45 mg/dL (ref 0.0–149.0)
VLDL: 9 mg/dL (ref 0.0–40.0)

## 2020-06-07 LAB — TSH: TSH: 1.6 u[IU]/mL (ref 0.35–4.50)

## 2020-06-07 LAB — PSA: PSA: 1.72 ng/mL (ref 0.10–4.00)

## 2020-06-07 MED ORDER — DOXEPIN HCL 10 MG PO CAPS: ORAL_CAPSULE | ORAL | 1 refills | Status: DC

## 2020-06-07 NOTE — Progress Notes (Signed)
Established Patient Office Visit  Subjective:  Patient ID: Christian Baird, male    DOB: 1956/02/12  Age: 65 y.o. MRN: [id number]  CC:  Chief Complaint  Patient presents with  . Establish Care    NP/establish care go over medical history.     HPI Christian Baird presents for establishment of care.  Carries a diagnosis of coronary artery disease status post MI 1 year ago.  Standing history of hypertension with favorable LDL with a low HDL okay.  Patient never smoked.  He is in recovery for 16-year and continues to attend AA.  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  Job is quite stressful.  He tried hydroxyzine for anxiety with some relief but would like to try doxepin because it has worked for a friend of his.  He tried Prozac many years ago and it did not agree with him.  Today over the years he has developed lateral epicondylitis in his left dominant elbow.  He also has Planter fasciitis in both heels.  He is not exercising as much as he would like.  Ongoing history of hypothyroidism and GERD.  Pends  Past Medical History:  Diagnosis Date  . Coronary artery disease   . GERD (gastroesophageal reflux disease)   . Hypertension   . Hypothyroidism     Past Surgical History:  Procedure Laterality Date  . ABDOMINAL SURGERY    . CORONARY STENT INTERVENTION N/A 07/29/2019   Procedure: CORONARY STENT INTERVENTION;  Surgeon: Swaziland, Peter M, MD;  Location: [facility];  Service: Cardiovascular;  Laterality: N/A;  . FRACTURE SURGERY    . LEFT HEART CATH AND CORONARY ANGIOGRAPHY N/A 07/29/2019   Procedure: LEFT HEART CATH AND CORONARY ANGIOGRAPHY;  Surgeon: Swaziland, Peter M, MD;  Location: [facility] INVASIVE CV [facility];  Service: Cardiovascular;  Laterality: N/A;    Family History  Problem Relation Age of Onset  . Hypertension Mother   . Diabetes Father     Social History   Socioeconomic History  . Marital status: Married    Spouse name: Not on file  . Number of children: Not on  file  . Years of education: Not on file  . Highest education level: Not on file  Occupational History  . Not on file  Tobacco Use  . Smoking status: Never Smoker  . Smokeless tobacco: Never Used  Vaping Use  . Vaping Use: Never used  Substance and Sexual Activity  . Alcohol use: Never    Alcohol/week: 0.0 standard drinks  . Drug use: Never  . Sexual activity: Yes  Other Topics Concern  . Not on file  Social History Narrative  . Not on file   Social Determinants of Health   Financial Resource Strain: Not on file  Food Insecurity: Not on file  Transportation Needs: Not on file  Physical Activity: Not on file  Stress: Not on file  Social Connections: Not on file  Intimate Partner Violence: Not on file    Outpatient Medications Prior to Visit  Medication Sig Dispense Refill  . aspirin 81 MG EC tablet Take 1 tablet (81 mg total) by mouth daily. 90 tablet 3  . atorvastatin (LIPITOR) 80 MG tablet Take 1 tablet (80 mg total) by mouth daily. 60 tablet 6  . hydrochlorothiazide (HYDRODIURIL) 25 MG tablet Take 1 tablet (25 mg total) by mouth daily. 90 tablet 3  . levothyroxine (SYNTHROID) 75 MCG tablet TAKE ONE TABLET BY MOUTH ONE TIME DAILY 0600 90 tablet 0  .  lisinopril (ZESTRIL) 40 MG tablet Take 1 tablet (40 mg total) by mouth daily. 90 tablet 1  . metoprolol succinate (TOPROL-XL) 50 MG 24 hr tablet Take 1 tablet (50 mg total) by mouth daily. 90 tablet 1  . pantoprazole (PROTONIX) 40 MG tablet Take 1 tablet (40 mg total) by mouth daily. 90 tablet 1  . spironolactone (ALDACTONE) 25 MG tablet Take 0.5 tablets (12.5 mg total) by mouth daily. 30 tablet 2  . ticagrelor (BRILINTA) 90 MG TABS tablet Take 1 tablet (90 mg total) by mouth 2 (two) times daily. 180 tablet 2  . hydrOXYzine (ATARAX/VISTARIL) 10 MG tablet Take 1 tablet (10 mg total) by mouth daily as needed. (Patient not taking: Reported on 06/07/2020) 30 tablet 1  . amLODipine (NORVASC) 10 MG tablet Take 1 tablet by mouth daily.  (Patient not taking: Reported on 06/07/2020)    . nitroGLYCERIN (NITROSTAT) 0.4 MG SL tablet Place 1 tablet (0.4 mg total) under the tongue every 5 (five) minutes x 3 doses as needed for chest pain. (Patient not taking: Reported on 06/07/2020) 25 tablet 1   No facility-administered medications prior to visit.    No Known Allergies  ROS Review of Systems  Constitutional: Negative.   HENT: Negative.   Respiratory: Negative.  Negative for chest tightness and shortness of breath.   Cardiovascular: Negative.  Negative for chest pain.  Gastrointestinal: Negative.   Endocrine: Negative for polyphagia and polyuria.  Genitourinary: Negative.   Musculoskeletal: Positive for arthralgias.  Neurological: Negative for speech difficulty and weakness.  Psychiatric/Behavioral: The patient is nervous/anxious.       Objective:    Physical Exam Vitals and nursing note reviewed.  Constitutional:      General: He is not in acute distress.    Appearance: Normal appearance. He is not ill-appearing, toxic-appearing or diaphoretic.  HENT:     Head: Normocephalic and atraumatic.     Right Ear: External ear normal.     Left Ear: External ear normal.  Eyes:     General:        Right eye: No discharge.        Left eye: No discharge.     Conjunctiva/sclera: Conjunctivae normal.  Pulmonary:     Effort: Pulmonary effort is normal.  Musculoskeletal:       Legs:  Skin:    General: Skin is warm and dry.  Neurological:     Mental Status: He is alert and oriented to person, place, and time.  Psychiatric:        Mood and Affect: Mood normal.        Behavior: Behavior normal.     BP (!) 142/80   Pulse (!) 51   Temp (!) 97.5 F (36.4 C) (Temporal)   Ht 5\' 6"  (1.676 m)   Wt 175 lb 12.8 oz (79.7 kg)   SpO2 99%   BMI 28.37 kg/m  Wt Readings from Last 3 Encounters:  06/07/20 175 lb 12.8 oz (79.7 kg)  03/30/20 179 lb (81.2 kg)  10/14/19 177 lb (80.3 kg)     Health Maintenance Due  Topic Date  Due  . Hepatitis C Screening  Never done  . TETANUS/TDAP  Never done    There are no preventive care reminders to display for this patient.  Lab Results  Component Value Date   TSH 2.618 07/29/2019   Lab Results  Component Value Date   WBC 9.9 07/30/2019   HGB 14.9 07/30/2019   HCT 44.5 07/30/2019  MCV 88.1 07/30/2019   PLT 247 07/30/2019   Lab Results  Component Value Date   NA 142 07/30/2019   K 3.6 07/30/2019   CO2 23 07/30/2019   GLUCOSE 92 07/30/2019   BUN 20 07/30/2019   CREATININE 1.41 (H) 07/30/2019   CALCIUM 8.4 (L) 07/30/2019   ANIONGAP 11 07/30/2019   Lab Results  Component Value Date   CHOL 111 10/08/2019   Lab Results  Component Value Date   HDL 33 (L) 10/08/2019   Lab Results  Component Value Date   LDLCALC 65 10/08/2019   Lab Results  Component Value Date   TRIG 55 10/08/2019   Lab Results  Component Value Date   CHOLHDL 3.4 10/08/2019   Lab Results  Component Value Date   HGBA1C 5.7 (H) 07/29/2019      Assessment & Plan:   Problem List Items Addressed This Visit      Cardiovascular and Mediastinum   Essential hypertension - Primary   Relevant Orders   CBC   Comprehensive metabolic panel   Urinalysis, Routine w reflex microscopic   Coronary artery disease due to lipid rich plaque   Relevant Orders   Comprehensive metabolic panel   Lipid panel     Digestive   Gastroesophageal reflux disease     Endocrine   Hypothyroidism   Relevant Orders   TSH     Musculoskeletal and Integument   Lateral epicondylitis of left elbow   Plantar fasciitis, bilateral     Other   Anxiety   Relevant Medications   doxepin (SINEQUAN) 10 MG capsule   Healthcare maintenance   Relevant Orders   PSA      Meds ordered this encounter  Medications  . doxepin (SINEQUAN) 10 MG capsule    Sig: May take one to two daily as needed.    Dispense:  60 capsule    Refill:  1    Follow-up: Return in about 3 months (around 09/06/2020), or if  symptoms worsen or fail to improve.  Patient will return for complete physical.  He would like to try doxepin for his anxiety.  Drowsy precautions were advised.  He is no longer taking Atarax.  He was given information on exercising and mindfulness based stress reduction.  He will follow-up with his cardiologist next month.  Advised him to lift with his palms up at work.  And he has an arm strap.  Discussed wearing shoes with good arch support and heel cushioning.   Mliss Sax, MD

## 2020-06-07 NOTE — Patient Instructions (Addendum)
Exercising to Stay Healthy To become healthy and stay healthy, it is recommended that you do moderate-intensity and vigorous-intensity exercise. You can tell that you are exercising at a moderate intensity if your heart starts beating faster and you start breathing faster but can still hold a conversation. You can tell that you are exercising at a vigorous intensity if you are breathing much harder and faster and cannot hold a conversation while exercising. Exercising regularly is important. It has many health benefits, such as:  Improving overall fitness, flexibility, and endurance.  Increasing bone density.  Helping with weight control.  Decreasing body fat.  Increasing muscle strength.  Reducing stress and tension.  Improving overall health. How often should I exercise? Choose an activity that you enjoy, and set realistic goals. Your health care provider can help you make an activity plan that works for you. Exercise regularly as told by your health care provider. This may include:  Doing strength training two times a week, such as: ? Lifting weights. ? Using resistance bands. ? Push-ups. ? Sit-ups. ? Yoga.  Doing a certain intensity of exercise for a given amount of time. Choose from these options: ? A total of 150 minutes of moderate-intensity exercise every week. ? A total of 75 minutes of vigorous-intensity exercise every week. ? A mix of moderate-intensity and vigorous-intensity exercise every week. Children, pregnant women, people who have not exercised regularly, people who are overweight, and older adults may need to talk with a health care provider about what activities are safe to do. If you have a medical condition, be sure to talk with your health care provider before you start a new exercise program. What are some exercise ideas? Moderate-intensity exercise ideas include:  Walking 1 mile (1.6 km) in about 15  minutes.  Biking.  Hiking.  Golfing.  Dancing.  Water aerobics. Vigorous-intensity exercise ideas include:  Walking 4.5 miles (7.2 km) or more in about 1 hour.  Jogging or running 5 miles (8 km) in about 1 hour.  Biking 10 miles (16.1 km) or more in about 1 hour.  Lap swimming.  Roller-skating or in-line skating.  Cross-country skiing.  Vigorous competitive sports, such as football, basketball, and soccer.  Jumping rope.  Aerobic dancing.   What are some everyday activities that can help me to get exercise?  Yard work, such as: ? Pushing a lawn mower. ? Raking and bagging leaves.  Washing your car.  Pushing a stroller.  Shoveling snow.  Gardening.  Washing windows or floors. How can I be more active in my day-to-day activities?  Use stairs instead of an elevator.  Take a walk during your lunch break.  If you drive, park your car farther away from your work or school.  If you take public transportation, get off one stop early and walk the rest of the way.  Stand up or walk around during all of your indoor phone calls.  Get up, stretch, and walk around every 30 minutes throughout the day.  Enjoy exercise with a friend. Support to continue exercising will help you keep a regular routine of activity. What guidelines can I follow while exercising?  Before you start a new exercise program, talk with your health care provider.  Do not exercise so much that you hurt yourself, feel dizzy, or get very short of breath.  Wear comfortable clothes and wear shoes with good support.  Drink plenty of water while you exercise to prevent dehydration or heat stroke.  Work out until   your breathing and your heartbeat get faster. Where to find more information  [place] Department of Health and Human Services: ThisPath.fi  Centers for Disease Control and Prevention (CDC): FootballExhibition.com.br Summary  Exercising regularly is important. It will improve your overall fitness,  flexibility, and endurance.  Regular exercise also will improve your overall health. It can help you control your weight, reduce stress, and improve your bone density.  Do not exercise so much that you hurt yourself, feel dizzy, or get very short of breath.  Before you start a new exercise program, talk with your health care provider. This information is not intended to replace advice given to you by your health care provider. Make sure you discuss any questions you have with your health care provider. Document Revised: 01/18/2017 Document Reviewed: 12/27/2016 Elsevier Patient Education  2021 Elsevier Inc.  Mindfulness-Based Stress Reduction Mindfulness-based stress reduction (MBSR) is a program that helps people learn to practice mindfulness. Mindfulness is the practice of intentionally paying attention to the present moment. MBSR focuses on developing self-awareness, which allows you to respond to life stress without judgment or negative emotions. It can be learned and practiced through techniques such as education, breathing exercises, meditation, and yoga. MBSR includes several mindfulness techniques in one program. MBSR works best when you understand the treatment, are willing to try new things, and can commit to spending time practicing what you learn. MBSR training may include learning about:  How your emotions, thoughts, and reactions affect your body.  New ways to respond to things that cause negative thoughts to start (triggers).  How to notice your thoughts and let go of them.  Practicing awareness of everyday things that you normally do without thinking.  The techniques and goals of different types of meditation. What are the benefits of MBSR? MBSR can have many benefits, which include helping you to:  Develop self-awareness. This refers to knowing and understanding yourself.  Learn skills and attitudes that help you to participate in your own health care.  Learn new ways  to care for yourself.  Be more accepting about how things are, and let things go.  Be less judgmental and approach things with an open mind.  Be patient with yourself and trust yourself more. MBSR has also been shown to:  Reduce negative emotions, such as depression and anxiety.  Improve memory and focus.  Change how you sense and approach pain.  Boost your body's ability to fight infections.  Help you connect better with other people.  Improve your sense of well-being. Follow these instructions at home:  Find a local in-person or online MBSR program.  Set aside some time regularly for mindfulness practice.  Find a mindfulness practice that works best for you. This may include one or more of the following: ? Meditation. Meditation involves focusing your mind on a certain thought or activity. ? Breathing awareness exercises. These help you to stay present by focusing on your breath. ? Body scan. For this practice, you lie down and pay attention to each part of your body from head to toe. You can identify tension and soreness and intentionally relax parts of your body. ? Yoga. Yoga involves stretching and breathing, and it can improve your ability to move and be flexible. It can also provide an experience of testing your body's limits, which can help you release stress. ? Mindful eating. This way of eating involves focusing on the taste, texture, color, and smell of each bite of food. Because this slows down eating  and helps you feel full sooner, it can be an important part of a weight-loss plan.  Find a podcast or recording that provides guidance for breathing awareness, body scan, or meditation exercises. You can listen to these any time when you have a free moment to rest without distractions.  Follow your treatment plan as told by your health care provider. This may include taking regular medicines and making changes to your diet or lifestyle as recommended.   How to practice  mindfulness To do a basic awareness exercise:  Find a comfortable place to sit.  Pay attention to the present moment. Observe your thoughts, feelings, and surroundings just as they are.  Avoid placing judgment on yourself, your feelings, or your surroundings. Make note of any judgment that comes up, and let it go.  Your mind may wander, and that is okay. Make note of when your thoughts drift, and return your attention to the present moment. To do basic mindfulness meditation:  Find a comfortable place to sit. This may include a stable chair or a firm floor cushion. ? Sit upright with your back straight. Let your arms fall next to your side with your hands resting on your legs. ? If sitting in a chair, rest your feet flat on the floor. ? If sitting on a cushion, cross your legs in front of you.  Keep your head in a neutral position with your chin dropped slightly. Relax your jaw and rest the tip of your tongue on the roof of your mouth. Drop your gaze to the floor. You can close your eyes if you like.  Breathe normally and pay attention to your breath. Feel the air moving in and out of your nose. Feel your belly expanding and relaxing with each breath.  Your mind may wander, and that is okay. Make note of when your thoughts drift, and return your attention to your breath.  Avoid placing judgment on yourself, your feelings, or your surroundings. Make note of any judgment or feelings that come up, let them go, and bring your attention back to your breath.  When you are ready, lift your gaze or open your eyes. Pay attention to how your body feels after the meditation. Where to find more information You can find more information about MBSR from:  Your health care provider.  Community-based meditation centers or programs.  Programs offered near you. Summary  Mindfulness-based stress reduction (MBSR) is a program that teaches you how to intentionally pay attention to the present moment.  It is used with other treatments to help you cope better with daily stress, emotions, and pain.  MBSR focuses on developing self-awareness, which allows you to respond to life stress without judgment or negative emotions.  MBSR programs may involve learning different mindfulness practices, such as breathing exercises, meditation, yoga, body scan, or mindful eating. Find a mindfulness practice that works best for you, and set aside time for it on a regular basis. This information is not intended to replace advice given to you by your health care provider. Make sure you discuss any questions you have with your health care provider. Document Revised: 10/23/2019 Document Reviewed: 10/23/2019 Elsevier Patient Education  2021 [employer].

## 2020-07-06 ENCOUNTER — Encounter: Payer: Self-pay | Admitting: Family Medicine

## 2020-07-06 DIAGNOSIS — N5201 Erectile dysfunction due to arterial insufficiency: Secondary | ICD-10-CM

## 2020-07-06 MED ORDER — SILDENAFIL CITRATE 20 MG PO TABS: ORAL_TABLET | ORAL | 3 refills | Status: DC

## 2020-08-22 ENCOUNTER — Other Ambulatory Visit: Payer: Self-pay | Admitting: Cardiovascular Disease

## 2020-09-05 ENCOUNTER — Other Ambulatory Visit: Payer: Self-pay

## 2020-09-06 ENCOUNTER — Encounter: Payer: Self-pay | Admitting: Family Medicine

## 2020-09-06 ENCOUNTER — Ambulatory Visit (INDEPENDENT_AMBULATORY_CARE_PROVIDER_SITE_OTHER): Payer: BC Managed Care – PPO | Admitting: Family Medicine

## 2020-09-06 VITALS — BP 142/70 | HR 52 | Temp 97.3°F | Ht 65.0 in | Wt 179.2 lb

## 2020-09-06 DIAGNOSIS — I10 Essential (primary) hypertension: Secondary | ICD-10-CM

## 2020-09-06 DIAGNOSIS — Z0000 Encounter for general adult medical examination without abnormal findings: Secondary | ICD-10-CM

## 2020-09-06 DIAGNOSIS — R7309 Other abnormal glucose: Secondary | ICD-10-CM | POA: Diagnosis not present

## 2020-09-06 DIAGNOSIS — R7989 Other specified abnormal findings of blood chemistry: Secondary | ICD-10-CM

## 2020-09-06 LAB — COMPREHENSIVE METABOLIC PANEL
ALT: 46 U/L (ref 0–53)
AST: 27 U/L (ref 0–37)
Albumin: 4.3 g/dL (ref 3.5–5.2)
Alkaline Phosphatase: 40 U/L (ref 39–117)
BUN: 30 mg/dL — ABNORMAL HIGH (ref 6–23)
CO2: 29 mEq/L (ref 19–32)
Calcium: 9.6 mg/dL (ref 8.4–10.5)
Chloride: 104 mEq/L (ref 96–112)
Creatinine, Ser: 1.54 mg/dL — ABNORMAL HIGH (ref 0.40–1.50)
GFR: 47.89 mL/min — ABNORMAL LOW (ref >60.00)
Glucose, Bld: 91 mg/dL (ref 70–99)
Potassium: 4.6 mEq/L (ref 3.5–5.1)
Sodium: 140 mEq/L (ref 135–145)
Total Bilirubin: 1 mg/dL (ref 0.2–1.2)
Total Protein: 6.9 g/dL (ref 6.0–8.3)

## 2020-09-06 LAB — HEMOGLOBIN A1C: Hgb A1c MFr Bld: 5.8 % (ref 4.6–6.5)

## 2020-09-06 NOTE — Progress Notes (Signed)
Established Patient Office Visit  Subjective:  Patient ID: Christian Baird, male    DOB: November 14, 1955  Age: 65 y.o. MRN: [id number]  CC:  Chief Complaint  Patient presents with   Annual Exam    CPE, no concerns. Patient not fasting.     HPI Christian Baird presents for for physical exam and follow-up of recent lab work.  Hemoglobin A1c has been elevated.  Blood sugars have been more or less normal.  Blood pressure is slightly elevated today.  At home he tells me its been running in the 120s over 80s.  He is in a stressful job.  He works for an Chief [employer] at [employer].  He had developed substernal chest pain pushing a heavy lady in her wheelchair.  Follow-up with cardiology as scheduled in the next week.  ALT was slightly elevated with blood work a couple of weeks ago.  He has been trying to do a better job hydrating.  He is experiencing nocturia x2 nightly.  He has not fluid restricting before bedtime.  No regular exercise other than when he is off of work.  Past Medical History:  Diagnosis Date   Coronary artery disease    GERD (gastroesophageal reflux disease)    Hypertension    Hypothyroidism     Past Surgical History:  Procedure Laterality Date   ABDOMINAL SURGERY     CORONARY STENT INTERVENTION N/A 07/29/2019   Procedure: CORONARY STENT INTERVENTION;  Surgeon: Swaziland, Peter M, MD;  Location: [facility] INVASIVE CV LAB;  Service: Cardiovascular;  Laterality: N/A;   FRACTURE SURGERY     LEFT HEART CATH AND CORONARY ANGIOGRAPHY N/A 07/29/2019   Procedure: LEFT HEART CATH AND CORONARY ANGIOGRAPHY;  Surgeon: Swaziland, Peter M, MD;  Location: [facility] INVASIVE CV LAB;  Service: Cardiovascular;  Laterality: N/A;    Family History  Problem Relation Age of Onset   Hypertension Mother    Diabetes Father     Social History   Socioeconomic History   Marital status: Married    Spouse name: Not on file   Number of children: Not on file   Years of education: Not on file   Highest education  level: Not on file  Occupational History   Not on file  Tobacco Use   Smoking status: Never   Smokeless tobacco: Never  Vaping Use   Vaping Use: Never used  Substance and Sexual Activity   Alcohol use: Never    Alcohol/week: 0.0 standard drinks   Drug use: Never   Sexual activity: Yes  Other Topics Concern   Not on file  Social History Narrative   Not on file   Social Determinants of Health   Financial Resource Strain: Not on file  Food Insecurity: Not on file  Transportation Needs: Not on file  Physical Activity: Not on file  Stress: Not on file  Social Connections: Not on file  Intimate Partner Violence: Not on file    Outpatient Medications Prior to Visit  Medication Sig Dispense Refill   aspirin 81 MG EC tablet Take 1 tablet (81 mg total) by mouth daily. 90 tablet 3   atorvastatin (LIPITOR) 80 MG tablet Take 1 tablet (80 mg total) by mouth daily. 60 tablet 6   doxepin (SINEQUAN) 10 MG capsule May take one to two daily as needed. 60 capsule 1   hydrochlorothiazide (HYDRODIURIL) 25 MG tablet Take 1 tablet (25 mg total) by mouth daily. 90 tablet 3   levothyroxine (SYNTHROID) 75 MCG tablet TAKE ONE  TABLET BY MOUTH ONE TIME DAILY 0600 90 tablet 0   lisinopril (ZESTRIL) 40 MG tablet Take 1 tablet (40 mg total) by mouth daily. 90 tablet 1   metoprolol succinate (TOPROL-XL) 50 MG 24 hr tablet Take 1 tablet (50 mg total) by mouth daily. 90 tablet 1   pantoprazole (PROTONIX) 40 MG tablet TAKE ONE TABLET BY MOUTH ONE TIME DAILY 90 tablet 0   sildenafil (REVATIO) 20 MG tablet May take 3-5 as needed 45 minutes prior to intercourse. 50 tablet 3   spironolactone (ALDACTONE) 25 MG tablet Take 0.5 tablets (12.5 mg total) by mouth daily. 30 tablet 2   ticagrelor (BRILINTA) 90 MG TABS tablet Take 1 tablet (90 mg total) by mouth 2 (two) times daily. 180 tablet 2   No facility-administered medications prior to visit.    No Known Allergies  ROS Review of Systems  Constitutional:  Negative.   HENT: Negative.    Eyes:  Negative for photophobia and visual disturbance.  Respiratory:  Negative for chest tightness, shortness of breath and wheezing.   Cardiovascular:  Positive for chest pain. Negative for palpitations.  Gastrointestinal: Negative.  Negative for anal bleeding and blood in stool.  Genitourinary:  Negative for difficulty urinating, frequency and urgency.  Musculoskeletal:  Negative for gait problem and joint swelling.  Skin:  Negative for pallor and rash.  Neurological:  Negative for speech difficulty and weakness.  Hematological:  Does not bruise/bleed easily.  Psychiatric/Behavioral: Negative.    Depression screen [facility] 2/9 09/06/2020 06/07/2020  Decreased Interest 0 0  Down, Depressed, Hopeless 0 0  PHQ - 2 Score 0 0       Objective:    Physical Exam  BP (!) 142/70   Pulse (!) 52   Temp (!) 97.3 F (36.3 C) (Temporal)   Ht 5\' 5"  (1.651 m) Comment: 5 5 1/4  Wt 179 lb 3.2 oz (81.3 kg)   SpO2 98%   BMI 29.82 kg/m  Wt Readings from Last 3 Encounters:  09/06/20 179 lb 3.2 oz (81.3 kg)  06/07/20 175 lb 12.8 oz (79.7 kg)  03/30/20 179 lb (81.2 kg)     Health Maintenance Due  Topic Date Due   Pneumococcal Vaccine 48-63 Years old (1 - PCV) Never done   Hepatitis C Screening  Never done   TETANUS/TDAP  Never done   Zoster Vaccines- Shingrix (1 of 2) Never done    There are no preventive care reminders to display for this patient.  Lab Results  Component Value Date   TSH 1.60 06/07/2020   Lab Results  Component Value Date   WBC 8.9 06/07/2020   HGB 14.3 06/07/2020   HCT 41.8 06/07/2020   MCV 90.0 06/07/2020   PLT 241.0 06/07/2020   Lab Results  Component Value Date   NA 139 06/07/2020   K 3.7 06/07/2020   CO2 28 06/07/2020   GLUCOSE 85 06/07/2020   BUN 39 (H) 06/07/2020   CREATININE 1.42 06/07/2020   BILITOT 1.0 06/07/2020   ALKPHOS 45 06/07/2020   AST 36 06/07/2020   ALT 62 (H) 06/07/2020   PROT 7.4 06/07/2020   ALBUMIN  4.4 06/07/2020   CALCIUM 9.4 06/07/2020   ANIONGAP 11 07/30/2019   GFR 52.88 (L) 06/07/2020   Lab Results  Component Value Date   CHOL 93 06/07/2020   Lab Results  Component Value Date   HDL 35.90 (L) 06/07/2020   Lab Results  Component Value Date   LDLCALC 48 06/07/2020  Lab Results  Component Value Date   TRIG 45.0 06/07/2020   Lab Results  Component Value Date   CHOLHDL 3 06/07/2020   Lab Results  Component Value Date   HGBA1C 5.7 (H) 07/29/2019      Assessment & Plan:   Problem List Items Addressed This Visit       Cardiovascular and Mediastinum   Essential hypertension   Relevant Orders   Comprehensive metabolic panel     Other   Healthcare maintenance - Primary   Elevated glucose   Relevant Orders   Hemoglobin A1c   Comprehensive metabolic panel   Other Visit Diagnoses     Elevated LFTs       Relevant Orders   Hepatitis C antibody   Comprehensive metabolic panel   Hepatitis B Surface AntiGEN       No orders of the defined types were placed in this encounter.   Follow-up: Return in about 6 months (around 03/09/2021).  Given information on health maintenance and disease prevention.  With his stressful job given information on dealing with stress and encouraged him to adopt a daily exercise program walking at least 30 minutes daily after clearance by cardiology.  Follow-up today on elevated hemoglobin A1c and LFT.  He will see cardiology in a few weeks for follow-up of his exertional chest pain.   Mliss Sax, MD

## 2020-09-07 ENCOUNTER — Other Ambulatory Visit: Payer: Self-pay | Admitting: Cardiovascular Disease

## 2020-09-07 LAB — HEPATITIS C ANTIBODY
Hepatitis C Ab: NONREACTIVE
SIGNAL TO CUT-OFF: 0.01 (ref <1.00)

## 2020-09-07 LAB — HEPATITIS B SURFACE ANTIGEN: Hepatitis B Surface Ag: NONREACTIVE

## 2020-09-20 ENCOUNTER — Other Ambulatory Visit: Payer: Self-pay | Admitting: Cardiovascular Disease

## 2020-09-20 NOTE — Progress Notes (Signed)
Cardiology Office Note:   Date:  09/21/2020  NAME:  Christian DaftRonald Sweigert    MRN: [id number] DOB:  Nov 04, 1955   PCP:  Mliss SaxKremer, William Alfred, MD  Cardiologist:  Reatha HarpsWesley T O'Neal, MD  Electrophysiologist:  None   Referring MD: Mliss SaxKremer, William Alfred,*   Chief Complaint  Patient presents with   Follow-up   History of Present Illness:   Christian Baird is a 65 y.o. male with a hx of CAD, HTN, HLD who presents for follow-up.  [place]  Reports it is [place].  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  He has enough to retire but wife wants him to continue working.  He reports he was helping a very large passenger with a wheelchair.  Had an episode of tightness in his chest while pushing a person.  He also gets tightness in his chest with stress.  He has improved with hydroxyzine.  He reports he can walk up to 3 miles without any other limitations.  No chest pain or shortness of breath with walking.  He is only able to walk 1-2 times per week due to [place].  Suspect his symptoms are atypical and related to stress as well as musculoskeletal in the setting of helping a very heavy passenger.  BP 140/76.  Well-controlled at home per his report.  He reports BPs 120/70 on average.  Seems to be well controlled.  He stopped Brilinta.  He will remain on aspirin and Lipitor indefinitely.  Most recent LDL 48.  This is at goal.  Cardiovascular examination normal.  Otherwise he is doing quite well.  Problem List 1. NSTEMI 07/29/2019 -DES to dRCA 2. HTN 3. HLD -T chol 93, HDL 36, LDL 48, TG 45  Past Medical History: Past Medical History:  Diagnosis Date   Coronary artery disease    GERD (gastroesophageal reflux disease)    Hypertension    Hypothyroidism     Past Surgical History: Past Surgical History:  Procedure Laterality Date   ABDOMINAL SURGERY     CORONARY STENT INTERVENTION N/A 07/29/2019   Procedure: CORONARY  STENT INTERVENTION;  Surgeon: SwazilandJordan, Peter M, MD;  Location: [facility] INVASIVE CV LAB;  Service: Cardiovascular;  Laterality: N/A;   FRACTURE SURGERY     LEFT HEART CATH AND CORONARY ANGIOGRAPHY N/A 07/29/2019   Procedure: LEFT HEART CATH AND CORONARY ANGIOGRAPHY;  Surgeon: SwazilandJordan, Peter M, MD;  [facility];  Service: Cardiovascular;  Laterality: N/A;    Current Medications: Current Meds  Medication Sig   aspirin 81 MG EC tablet Take 1 tablet (81 mg total) by mouth daily.   atorvastatin (LIPITOR) 80 MG tablet Take 1 tablet (80 mg total) by mouth daily.   doxepin (SINEQUAN) 10 MG capsule May take one to two daily as needed.   levothyroxine (SYNTHROID) 75 MCG tablet TAKE ONE TABLET BY MOUTH ONE TIME DAILY 0600   lisinopril (ZESTRIL) 40 MG tablet Take 1 tablet (40 mg total) by mouth daily.   metoprolol succinate (TOPROL-XL) 50 MG 24 hr tablet Take 1 tablet (50 mg total) by mouth daily.   pantoprazole (PROTONIX) 40 MG tablet TAKE ONE TABLET BY MOUTH ONE TIME DAILY   sildenafil (REVATIO) 20 MG tablet May take 3-5 as needed 45 minutes prior to intercourse.   ticagrelor (BRILINTA) 90 MG TABS tablet Take 1 tablet (90 mg total) by mouth 2 (two) times daily.   [DISCONTINUED]  hydrOXYzine (ATARAX/VISTARIL) 10 MG tablet Take 1 tablet (10 mg total) by mouth 3 (three) times daily as needed.     Allergies:    Patient has no known allergies.   Social History: Social History   Socioeconomic History   Marital status: Married    Spouse name: Not on file   Number of children: Not on file   Years of education: Not on file   Highest education level: Not on file  Occupational History   Not on file  Tobacco Use   Smoking status: Never   Smokeless tobacco: Never  Vaping Use   Vaping Use: Never used  Substance and Sexual Activity   Alcohol use: Never    Alcohol/week: 0.0 standard drinks   Drug use: Never   Sexual activity: Yes  Other Topics Concern   Not on file  Social History  Narrative   Not on file   Social Determinants of Health   Financial Resource Strain: Not on file  Food Insecurity: Not on file  Transportation Needs: Not on file  Physical Activity: Not on file  Stress: Not on file  Social Connections: Not on file     Family History: The patient's family history includes Diabetes in his father; Hypertension in his mother.  ROS:   All other ROS reviewed and negative. Pertinent positives noted in the HPI.     EKGs/Labs/Other Studies Reviewed:   The following studies were personally reviewed by me today:  EKG:  EKG is ordered today.  The ekg ordered today demonstrates SB 49 bpm, no acute changes, and was personally reviewed by me.   LHC  Prox LAD to Mid LAD lesion is 30% stenosed. Dist RCA lesion is 95% stenosed. A drug-eluting stent was successfully placed using a STENT RESOLUTE ONYX [id number]. Post intervention, there is a 0% residual stenosis. Prox RCA to Mid RCA lesion is 20% stenosed. The left ventricular systolic function is normal. LV end diastolic pressure is normal. The left ventricular ejection fraction is 55-65% by visual estimate.   1. Single vessel obstructive CAD 2. Normal LV function 3. Normal LVEDP 4. Successful PCI of the distal RCA with DES x 1.    TTE 07/29/2019 1. Left ventricular ejection fraction, by estimation, is 55 to 60%. The  left ventricle has normal function. The left ventricle has no regional  wall motion abnormalities. Left ventricular diastolic parameters were  normal.   2. Right ventricular systolic function is normal. The right ventricular  size is normal. Tricuspid regurgitation signal is inadequate for assessing  PA pressure.   3. Left atrial size was mildly dilated.   4. The mitral valve is normal in structure. Trivial mitral valve  regurgitation. No evidence of mitral stenosis.   5. The aortic valve is tricuspid. Aortic valve regurgitation is not  visualized. No aortic stenosis is present.   6. There  is a linear echodensity in the ascending aorta. This may be  artifact, but dissection cannot be excluded.. Aortic abnormal.   7. The inferior vena cava is normal in size with greater than 50%  respiratory variability, suggesting right atrial pressure of 3 mmHg.   Recent Labs: 06/07/2020: Hemoglobin 14.3; Platelets 241.0; TSH 1.60 09/06/2020: ALT 46; BUN 30; Creatinine, Ser 1.54; Potassium 4.6; Sodium 140   Recent Lipid Panel    Component Value Date/Time   CHOL 93 06/07/2020 1038   CHOL 111 10/08/2019 0843   TRIG 45.0 06/07/2020 1038   HDL 35.90 (L) 06/07/2020 1038   HDL 33 (  L) 10/08/2019 0843   CHOLHDL 3 06/07/2020 1038   VLDL 9.0 06/07/2020 1038   LDLCALC 48 06/07/2020 1038   LDLCALC 65 10/08/2019 0843    Physical Exam:   VS:  BP 140/76   Pulse (!) 49   Ht 5\' 5"  (1.651 m)   Wt 177 lb 6.4 oz (80.5 kg)   SpO2 99%   BMI 29.52 kg/m    Wt Readings from Last 3 Encounters:  09/21/20 177 lb 6.4 oz (80.5 kg)  09/06/20 179 lb 3.2 oz (81.3 kg)  06/07/20 175 lb 12.8 oz (79.7 kg)    General: Well nourished, well developed, in no acute distress Head: Atraumatic, normal size  Eyes: PEERLA, EOMI  Neck: Supple, no JVD Endocrine: No thryomegaly Cardiac: Normal S1, S2; RRR; no murmurs, rubs, or gallops Lungs: Clear to auscultation bilaterally, no wheezing, rhonchi or rales  Abd: Soft, nontender, no hepatomegaly  Ext: No edema, pulses 2+ Musculoskeletal: No deformities, BUE and BLE strength normal and equal Skin: Warm and dry, no rashes   Neuro: Alert and oriented to person, place, time, and situation, CNII-XII grossly intact, no focal deficits  Psych: Normal mood and affect   ASSESSMENT:   Ryne Mctigue is a 65 y.o. male who presents for the following: 1. Coronary artery disease involving native coronary artery of native heart without angina pectoris   2. Mixed hyperlipidemia   3. Essential hypertension   4. Anxiety     PLAN:   1. Coronary artery disease involving native  coronary artery of native heart without angina pectoris 2. Mixed hyperlipidemia -Non-STEMI 07/29/2019.  Drug-eluting stent to the distal RCA.  Minimal disease in the LAD.  He has completed 1 year of DAPT.  Continue aspirin.  Stop Brilinta. -He is on Lipitor 80 mg daily.  Most recent LDL at goal.  Continue this. -He reports atypical chest tightness.  I suspect this is stress related.  Also could be musculoskeletal as he was helping a heavy passenger when this happened.  He can walk 3 miles without any symptoms concerning for angina.  We will consider a stress test if symptoms continue to occur however I suspect this is all stress related.  3. Essential hypertension -BP well controlled at home.  Continue metoprolol succinate 50 mg daily.  On HCTZ 25 mg daily.  On Aldactone 12.5 mg daily.  He is also on lisinopril 40 mg daily.  4. Anxiety -Anxiety reported.  Refill hydroxyzine 10 mg 3 times daily as needed.     Disposition: Return in about 1 year (around 09/21/2021).  Medication Adjustments/Labs and Tests Ordered: Current medicines are reviewed at length with the patient today.  Concerns regarding medicines are outlined above.  Orders Placed This Encounter  Procedures   EKG 12-Lead    Meds ordered this encounter  Medications   DISCONTD: hydrOXYzine (ATARAX/VISTARIL) 10 MG tablet    Sig: Take 1 tablet (10 mg total) by mouth 3 (three) times daily as needed.    Dispense:  30 tablet    Refill:  2   hydrOXYzine (ATARAX/VISTARIL) 10 MG tablet    Sig: Take 1 tablet (10 mg total) by mouth 3 (three) times daily as needed.    Dispense:  30 tablet    Refill:  2     Patient Instructions  Medication Instructions:   TAKE HYDROXYZINE 10 MG ONE TABLET THREE TIMES DAILY AS NEEDED   *If you need a refill on your cardiac medications before your next appointment, please call your pharmacy*  Follow-Up: At [facility], you and your health needs are our priority.  As part of our continuing mission  to provide you with exceptional heart care, we have created designated Provider Care Teams.  These Care Teams include your primary Cardiologist (physician) and Advanced Practice Providers (APPs -  Physician Assistants and Nurse Practitioners) who all work together to provide you with the care you need, when you need it.  We recommend signing up for the patient portal called "MyChart".  Sign up information is provided on this After Visit Summary.  MyChart is used to connect with patients for Virtual Visits (Telemedicine).  Patients are able to view lab/test results, encounter notes, upcoming appointments, etc.  Non-urgent messages can be sent to your provider as well.   To learn more about what you can do with MyChart, go to [email or web address].    Your next appointment:   12 month(s)  The format for your next appointment:   In Person  Provider:   Lennie Odor, MD    Time Spent with Patient: I have spent a total of 35 minutes with patient reviewing hospital notes, telemetry, EKGs, labs and examining the patient as well as establishing an assessment and plan that was discussed with the patient.  > 50% of time was spent in direct patient care.  Signed, Lenna Gilford. Flora Lipps, MD, [facility]  [facility]  [address] [place], [place] [address] [phone]  09/21/2020 10:26 AM

## 2020-09-21 ENCOUNTER — Encounter: Payer: Self-pay | Admitting: Cardiovascular Disease

## 2020-09-21 ENCOUNTER — Ambulatory Visit (INDEPENDENT_AMBULATORY_CARE_PROVIDER_SITE_OTHER): Payer: BC Managed Care – PPO | Admitting: Cardiovascular Disease

## 2020-09-21 ENCOUNTER — Other Ambulatory Visit: Payer: Self-pay

## 2020-09-21 VITALS — BP 140/76 | HR 49 | Ht 65.0 in | Wt 177.4 lb

## 2020-09-21 DIAGNOSIS — E782 Mixed hyperlipidemia: Secondary | ICD-10-CM

## 2020-09-21 DIAGNOSIS — I10 Essential (primary) hypertension: Secondary | ICD-10-CM

## 2020-09-21 DIAGNOSIS — I2510 Atherosclerotic heart disease of native coronary artery without angina pectoris: Secondary | ICD-10-CM

## 2020-09-21 DIAGNOSIS — F419 Anxiety disorder, unspecified: Secondary | ICD-10-CM

## 2020-09-21 MED ORDER — HYDROXYZINE HCL 10 MG PO TABS: 10.0000 mg | ORAL_TABLET | Freq: Three times a day (TID) | ORAL | 2 refills | Status: DC | PRN

## 2020-09-21 NOTE — Patient Instructions (Signed)
Medication Instructions:   TAKE HYDROXYZINE 10 MG ONE TABLET THREE TIMES DAILY AS NEEDED   *If you need a refill on your cardiac medications before your next appointment, please call your pharmacy*  Follow-Up: At [facility], you and your health needs are our priority.  As part of our continuing mission to provide you with exceptional heart care, we have created designated Provider Care Teams.  These Care Teams include your primary Cardiologist (physician) and Advanced Practice Providers (APPs -  Physician Assistants and Nurse Practitioners) who all work together to provide you with the care you need, when you need it.  We recommend signing up for the patient portal called "MyChart".  Sign up information is provided on this After Visit Summary.  MyChart is used to connect with patients for Virtual Visits (Telemedicine).  Patients are able to view lab/test results, encounter notes, upcoming appointments, etc.  Non-urgent messages can be sent to your provider as well.   To learn more about what you can do with MyChart, go to [email or web address].    Your next appointment:   12 month(s)  The format for your next appointment:   In Person  Provider:   Lennie Odor, MD

## 2020-09-26 ENCOUNTER — Other Ambulatory Visit: Payer: Self-pay | Admitting: Cardiovascular Disease

## 2020-10-10 ENCOUNTER — Other Ambulatory Visit: Payer: Self-pay | Admitting: Cardiovascular Disease

## 2020-10-30 ENCOUNTER — Other Ambulatory Visit: Payer: Self-pay | Admitting: Cardiovascular Disease

## 2020-11-07 ENCOUNTER — Other Ambulatory Visit: Payer: Self-pay

## 2020-11-07 MED ORDER — LEVOTHYROXINE SODIUM 75 MCG PO TABS: ORAL_TABLET | ORAL | 2 refills | Status: DC

## 2020-11-07 NOTE — Telephone Encounter (Signed)
Refill request for :  Synthroid 75 mcg LR 05/04/20, #90, 0 rf  ( Dr Flora Lipps) LOV 09/06/20   FOV  none scheduled.    Please review and advise.  Thanks. Dm/cma

## 2020-12-05 ENCOUNTER — Other Ambulatory Visit: Payer: Self-pay | Admitting: Cardiovascular Disease

## 2021-01-09 ENCOUNTER — Encounter: Payer: Self-pay | Admitting: Family Medicine

## 2021-01-10 ENCOUNTER — Other Ambulatory Visit: Payer: Self-pay

## 2021-01-10 DIAGNOSIS — E039 Hypothyroidism, unspecified: Secondary | ICD-10-CM

## 2021-01-10 DIAGNOSIS — K219 Gastro-esophageal reflux disease without esophagitis: Secondary | ICD-10-CM

## 2021-01-10 DIAGNOSIS — E785 Hyperlipidemia, unspecified: Secondary | ICD-10-CM

## 2021-01-10 DIAGNOSIS — I10 Essential (primary) hypertension: Secondary | ICD-10-CM

## 2021-01-10 DIAGNOSIS — N5201 Erectile dysfunction due to arterial insufficiency: Secondary | ICD-10-CM

## 2021-01-10 MED ORDER — PANTOPRAZOLE SODIUM 40 MG PO TBEC
40.0000 mg | DELAYED_RELEASE_TABLET | Freq: Every day | ORAL | 3 refills | Status: DC
Start: 2021-01-10 — End: 2022-03-21

## 2021-01-10 MED ORDER — METOPROLOL SUCCINATE ER 50 MG PO TB24: 50.0000 mg | ORAL_TABLET | Freq: Every day | ORAL | 0 refills | Status: DC

## 2021-01-10 MED ORDER — HYDROCHLOROTHIAZIDE 25 MG PO TABS: 25.0000 mg | ORAL_TABLET | Freq: Every day | ORAL | 3 refills | Status: DC

## 2021-01-10 MED ORDER — SPIRONOLACTONE 25 MG PO TABS: 12.5000 mg | ORAL_TABLET | Freq: Every day | ORAL | 11 refills | Status: DC

## 2021-01-10 MED ORDER — LISINOPRIL 40 MG PO TABS: 40.0000 mg | ORAL_TABLET | Freq: Every day | ORAL | 1 refills | Status: DC

## 2021-01-10 MED ORDER — LEVOTHYROXINE SODIUM 75 MCG PO TABS
ORAL_TABLET | ORAL | 2 refills | Status: DC
Start: 2021-01-10 — End: 2022-02-05

## 2021-01-10 MED ORDER — ATORVASTATIN CALCIUM 80 MG PO TABS: 80.0000 mg | ORAL_TABLET | Freq: Every day | ORAL | 3 refills | Status: DC

## 2021-03-16 ENCOUNTER — Other Ambulatory Visit: Payer: Self-pay | Admitting: Cardiovascular Disease

## 2021-03-16 DIAGNOSIS — F419 Anxiety disorder, unspecified: Secondary | ICD-10-CM

## 2021-04-27 ENCOUNTER — Other Ambulatory Visit: Payer: Self-pay | Admitting: Family Medicine

## 2021-04-27 DIAGNOSIS — I10 Essential (primary) hypertension: Secondary | ICD-10-CM

## 2021-07-24 ENCOUNTER — Other Ambulatory Visit: Payer: Self-pay | Admitting: Family Medicine

## 2021-07-24 DIAGNOSIS — I10 Essential (primary) hypertension: Secondary | ICD-10-CM

## 2021-07-25 ENCOUNTER — Encounter: Payer: Self-pay | Admitting: Family Medicine

## 2021-07-25 ENCOUNTER — Ambulatory Visit: Payer: BC Managed Care – PPO | Admitting: Family Medicine

## 2021-07-25 VITALS — BP 114/72 | HR 51 | Temp 97.5°F | Ht 65.0 in | Wt 174.6 lb

## 2021-07-25 DIAGNOSIS — E039 Hypothyroidism, unspecified: Secondary | ICD-10-CM | POA: Diagnosis not present

## 2021-07-25 DIAGNOSIS — I2583 Coronary atherosclerosis due to lipid rich plaque: Secondary | ICD-10-CM

## 2021-07-25 DIAGNOSIS — Z0000 Encounter for general adult medical examination without abnormal findings: Secondary | ICD-10-CM | POA: Diagnosis not present

## 2021-07-25 DIAGNOSIS — I10 Essential (primary) hypertension: Secondary | ICD-10-CM | POA: Diagnosis not present

## 2021-07-25 DIAGNOSIS — I2510 Atherosclerotic heart disease of native coronary artery without angina pectoris: Secondary | ICD-10-CM | POA: Diagnosis not present

## 2021-07-25 DIAGNOSIS — E782 Mixed hyperlipidemia: Secondary | ICD-10-CM

## 2021-07-25 DIAGNOSIS — N1832 Chronic kidney disease, stage 3b: Secondary | ICD-10-CM

## 2021-07-25 DIAGNOSIS — R7309 Other abnormal glucose: Secondary | ICD-10-CM | POA: Diagnosis not present

## 2021-07-25 DIAGNOSIS — E875 Hyperkalemia: Secondary | ICD-10-CM

## 2021-07-25 LAB — CBC
HCT: 40.3 % (ref 39.0–52.0)
Hemoglobin: 14 g/dL (ref 13.0–17.0)
MCHC: 34.6 g/dL (ref 30.0–36.0)
MCV: 88.5 fl (ref 78.0–100.0)
Platelets: 239 10*3/uL (ref 150.0–400.0)
RBC: 4.55 Mil/uL (ref 4.22–5.81)
RDW: 13.7 % (ref 11.5–15.5)
WBC: 10.7 10*3/uL — ABNORMAL HIGH (ref 4.0–10.5)

## 2021-07-25 LAB — URINALYSIS, ROUTINE W REFLEX MICROSCOPIC
Bilirubin Urine: NEGATIVE
Hgb urine dipstick: NEGATIVE
Ketones, ur: NEGATIVE
Leukocytes,Ua: NEGATIVE
Nitrite: NEGATIVE
RBC / HPF: NONE SEEN (ref 0–?)
Specific Gravity, Urine: 1.015 (ref 1.000–1.030)
Total Protein, Urine: NEGATIVE
Urine Glucose: NEGATIVE
Urobilinogen, UA: 0.2 (ref 0.0–1.0)
pH: 5.5 (ref 5.0–8.0)

## 2021-07-25 LAB — LIPID PANEL
Cholesterol: 107 mg/dL (ref 0–200)
HDL: 33.8 mg/dL — ABNORMAL LOW (ref >39.00)
LDL Cholesterol: 60 mg/dL (ref 0–99)
NonHDL: 72.98
Total CHOL/HDL Ratio: 3
Triglycerides: 66 mg/dL (ref 0.0–149.0)
VLDL: 13.2 mg/dL (ref 0.0–40.0)

## 2021-07-25 LAB — COMPREHENSIVE METABOLIC PANEL
ALT: 50 U/L (ref 0–53)
AST: 25 U/L (ref 0–37)
Albumin: 4.3 g/dL (ref 3.5–5.2)
Alkaline Phosphatase: 40 U/L (ref 39–117)
BUN: 37 mg/dL — ABNORMAL HIGH (ref 6–23)
CO2: 26 mEq/L (ref 19–32)
Calcium: 10.1 mg/dL (ref 8.4–10.5)
Chloride: 104 mEq/L (ref 96–112)
Creatinine, Ser: 1.88 mg/dL — ABNORMAL HIGH (ref 0.40–1.50)
GFR: 37.46 mL/min — ABNORMAL LOW (ref >60.00)
Glucose, Bld: 85 mg/dL (ref 70–99)
Potassium: 5.3 mEq/L — ABNORMAL HIGH (ref 3.5–5.1)
Sodium: 139 mEq/L (ref 135–145)
Total Bilirubin: 1.1 mg/dL (ref 0.2–1.2)
Total Protein: 7.2 g/dL (ref 6.0–8.3)

## 2021-07-25 LAB — MICROALBUMIN / CREATININE URINE RATIO
Creatinine,U: 121.2 mg/dL
Microalb Creat Ratio: 0.7 mg/g (ref 0.0–30.0)
Microalb, Ur: 0.8 mg/dL (ref 0.0–1.9)

## 2021-07-25 LAB — TSH: TSH: 1.92 u[IU]/mL (ref 0.35–5.50)

## 2021-07-25 LAB — PSA: PSA: 1.89 ng/mL (ref 0.10–4.00)

## 2021-07-25 LAB — HEMOGLOBIN A1C: Hgb A1c MFr Bld: 5.8 % (ref 4.6–6.5)

## 2021-07-25 NOTE — Progress Notes (Addendum)
Established Patient Office Visit  Subjective   Patient ID: Christian Baird, male    DOB: 1955-12-24  Age: 66 y.o. MRN: [id number]  Chief Complaint  Patient presents with   Follow-up    Routine follow up on medications. Patient fasting for labs.     HPI for health check and yearly follow-up of hypertension, hyperlipidemia with history of coronary artery disease, elevated glucose, hypothyroidism.  Continues to work for the [employer] on a full-time basis.  No regular exercise.  Has been seeing dentist regularly.  Has completed course of therapy with Brilinta.  Fasting today.  Blood pressure well controlled with HCTZ, lisinopril, Aldactone and metoprolol.  On high-dose statin with above history.    Review of Systems  Constitutional: Negative.   HENT: Negative.    Eyes:  Negative for blurred vision, discharge and redness.  Respiratory: Negative.    Cardiovascular: Negative.   Gastrointestinal:  Negative for abdominal pain, blood in stool, constipation, diarrhea and melena.  Genitourinary: Negative.  Negative for frequency and urgency.  Musculoskeletal: Negative.  Negative for myalgias.  Skin:  Negative for rash.  Neurological:  Negative for tingling, loss of consciousness and weakness.  Endo/Heme/Allergies:  Negative for polydipsia.     07/25/2021    8:52 AM 09/06/2020    9:59 AM 09/06/2020    8:32 AM  Depression screen PHQ 2/9  Decreased Interest 0 0 0  Down, Depressed, Hopeless 0 0 0  PHQ - 2 Score 0 0 0  Altered sleeping  1   Tired, decreased energy  1   Change in appetite  0   Feeling bad or failure about yourself   0   Trouble concentrating  0   Moving slowly or fidgety/restless  0   Suicidal thoughts  0   PHQ-9 Score  2   Difficult doing work/chores  Somewhat difficult        Objective:     BP 114/72 (BP Location: Right Arm, Patient Position: Sitting, Cuff Size: Normal)   Pulse (!) 51   Temp (!) 97.5 F (36.4 C) (Temporal)   Ht 5\' 5"  (1.651 m)   Wt 174 lb 9.6 oz  (79.2 kg)   SpO2 99%   BMI 29.05 kg/m  Wt Readings from Last 3 Encounters:  07/25/21 174 lb 9.6 oz (79.2 kg)  09/21/20 177 lb 6.4 oz (80.5 kg)  09/06/20 179 lb 3.2 oz (81.3 kg)      Physical Exam Constitutional:      General: He is not in acute distress.    Appearance: Normal appearance. He is not ill-appearing, toxic-appearing or diaphoretic.  HENT:     Head: Normocephalic and atraumatic.     Right Ear: External ear normal.     Left Ear: External ear normal.     Mouth/Throat:     Mouth: Mucous membranes are moist.     Pharynx: Oropharynx is clear. No oropharyngeal exudate or posterior oropharyngeal erythema.  Eyes:     General: No scleral icterus.       Right eye: No discharge.        Left eye: No discharge.     Extraocular Movements: Extraocular movements intact.     Conjunctiva/sclera: Conjunctivae normal.     Pupils: Pupils are equal, round, and reactive to light.  Cardiovascular:     Rate and Rhythm: Normal rate and regular rhythm.  Pulmonary:     Effort: Pulmonary effort is normal. No respiratory distress.     Breath sounds: Normal breath  sounds.  Abdominal:     General: Bowel sounds are normal.     Tenderness: There is no abdominal tenderness. There is no guarding.  Musculoskeletal:     Cervical back: No rigidity or tenderness.  Skin:    General: Skin is warm and dry.  Neurological:     Mental Status: He is alert and oriented to person, place, and time.  Psychiatric:        Mood and Affect: Mood normal.        Behavior: Behavior normal.     Results for orders placed or performed in visit on 07/25/21  CBC  Result Value Ref Range   WBC 10.7 (H) 4.0 - 10.5 K/uL   RBC 4.55 4.22 - 5.81 Mil/uL   Platelets 239.0 150.0 - 400.0 K/uL   Hemoglobin 14.0 13.0 - 17.0 g/dL   HCT 15.1 76.1 - 60.7 %   MCV 88.5 78.0 - 100.0 fl   MCHC 34.6 30.0 - 36.0 g/dL   RDW 37.1 06.2 - 69.4 %  Comprehensive metabolic panel  Result Value Ref Range   Sodium 139 135 - 145 mEq/L    Potassium 5.3 (H) 3.5 - 5.1 mEq/L   Chloride 104 96 - 112 mEq/L   CO2 26 19 - 32 mEq/L   Glucose, Bld 85 70 - 99 mg/dL   BUN 37 (H) 6 - 23 mg/dL   Creatinine, Ser 8.54 (H) 0.40 - 1.50 mg/dL   Total Bilirubin 1.1 0.2 - 1.2 mg/dL   Alkaline Phosphatase 40 39 - 117 U/L   AST 25 0 - 37 U/L   ALT 50 0 - 53 U/L   Total Protein 7.2 6.0 - 8.3 g/dL   Albumin 4.3 3.5 - 5.2 g/dL   GFR 62.70 (L) >35.00 mL/min   Calcium 10.1 8.4 - 10.5 mg/dL  Hemoglobin X3G  Result Value Ref Range   Hgb A1c MFr Bld 5.8 4.6 - 6.5 %  Lipid panel  Result Value Ref Range   Cholesterol 107 0 - 200 mg/dL   Triglycerides 18.2 0.0 - 149.0 mg/dL   HDL 99.37 (L) >16.96 mg/dL   VLDL 78.9 0.0 - 38.1 mg/dL   LDL Cholesterol 60 0 - 99 mg/dL   Total CHOL/HDL Ratio 3    NonHDL 72.98   PSA  Result Value Ref Range   PSA 1.89 0.10 - 4.00 ng/mL  Urinalysis, Routine w reflex microscopic  Result Value Ref Range   Color, Urine YELLOW Yellow;Lt. Yellow;Straw;Dark Yellow;Amber;Green;Red;Brown   APPearance CLEAR Clear;Turbid;Slightly Cloudy;Cloudy   Specific Gravity, Urine 1.015 1.000 - 1.030   pH 5.5 5.0 - 8.0   Total Protein, Urine NEGATIVE Negative   Urine Glucose NEGATIVE Negative   Ketones, ur NEGATIVE Negative   Bilirubin Urine NEGATIVE Negative   Hgb urine dipstick NEGATIVE Negative   Urobilinogen, UA 0.2 0.0 - 1.0   Leukocytes,Ua NEGATIVE Negative   Nitrite NEGATIVE Negative   WBC, UA 0-2/hpf 0-2/hpf   RBC / HPF none seen 0-2/hpf   Mucus, UA Presence of (A) None   Squamous Epithelial / LPF Rare(0-4/hpf) Rare(0-4/hpf)   Hyaline Casts, UA Presence of (A) None  Microalbumin / creatinine urine ratio  Result Value Ref Range   Microalb, Ur 0.8 0.0 - 1.9 mg/dL   Creatinine,U 017.5 mg/dL   Microalb Creat Ratio 0.7 0.0 - 30.0 mg/g  TSH  Result Value Ref Range   TSH 1.92 0.35 - 5.50 uIU/mL      The ASCVD Risk score (Arnett DK, et  al., 2019) failed to calculate for the following reasons:   The patient has a prior  MI or stroke diagnosis    Assessment & Plan:   Problem List Items Addressed This Visit       Cardiovascular and Mediastinum   Essential hypertension   Relevant Orders   CBC (Completed)   Comprehensive metabolic panel (Completed)   Urinalysis, Routine w reflex microscopic (Completed)   Microalbumin / creatinine urine ratio (Completed)   Coronary artery disease due to lipid rich plaque   Relevant Orders   Lipid panel (Completed)     Endocrine   Hypothyroidism   Relevant Orders   TSH (Completed)     Genitourinary   Stage 3b chronic kidney disease (HCC)     Other   Hyperlipidemia   Relevant Orders   Comprehensive metabolic panel (Completed)   Lipid panel (Completed)   Healthcare maintenance - Primary   Relevant Orders   PSA (Completed)   Elevated glucose   Relevant Orders   Comprehensive metabolic panel (Completed)   Hemoglobin A1c (Completed)   Hyperkalemia    Return in about 6 weeks (around 09/05/2021), or if symptoms worsen or fail to improve.  Encouraged regular exercise for 30 minutes at least 5 days weekly.  Walking is great.  Information given for health maintenance and disease prevention.  Continue above medications.  Mliss Sax, MD 6/6 addendum: CKD changed from 3a to 3b. Potassium elevated. Have advised patient to hold Aldactone and hydrate well.  Advised 6 week follow up.

## 2021-07-25 NOTE — Progress Notes (Signed)
Labs were mostly okay but there is a decline in kidney function and an elevated potassium level.  Please hold the Aldactone or spironolactone and hydrate well.  Follow-up with me in 6 weeks for recheck.

## 2021-08-30 ENCOUNTER — Other Ambulatory Visit: Payer: Self-pay | Admitting: Family Medicine

## 2021-08-30 DIAGNOSIS — I10 Essential (primary) hypertension: Secondary | ICD-10-CM

## 2021-09-11 ENCOUNTER — Encounter: Payer: Self-pay | Admitting: Family Medicine

## 2021-09-18 ENCOUNTER — Other Ambulatory Visit: Payer: Self-pay | Admitting: Family Medicine

## 2021-09-18 DIAGNOSIS — F419 Anxiety disorder, unspecified: Secondary | ICD-10-CM

## 2021-09-18 NOTE — Telephone Encounter (Signed)
Appointment scheduled for follow up 

## 2021-09-21 ENCOUNTER — Ambulatory Visit: Payer: BC Managed Care – PPO | Admitting: Family Medicine

## 2021-09-21 ENCOUNTER — Encounter: Payer: Self-pay | Admitting: Family Medicine

## 2021-09-21 VITALS — BP 138/70 | HR 55 | Temp 98.0°F | Ht 65.0 in | Wt 177.8 lb

## 2021-09-21 DIAGNOSIS — I10 Essential (primary) hypertension: Secondary | ICD-10-CM

## 2021-09-21 DIAGNOSIS — E875 Hyperkalemia: Secondary | ICD-10-CM

## 2021-09-21 DIAGNOSIS — N1832 Chronic kidney disease, stage 3b: Secondary | ICD-10-CM | POA: Diagnosis not present

## 2021-09-21 LAB — BASIC METABOLIC PANEL
BUN: 23 mg/dL (ref 6–23)
CO2: 30 mEq/L (ref 19–32)
Calcium: 9.1 mg/dL (ref 8.4–10.5)
Chloride: 101 mEq/L (ref 96–112)
Creatinine, Ser: 1.63 mg/dL — ABNORMAL HIGH (ref 0.40–1.50)
GFR: 44.41 mL/min — ABNORMAL LOW (ref >60.00)
Glucose, Bld: 81 mg/dL (ref 70–99)
Potassium: 3.7 mEq/L (ref 3.5–5.1)
Sodium: 139 mEq/L (ref 135–145)

## 2021-09-21 NOTE — Progress Notes (Addendum)
Established Patient Office Visit  Subjective   Patient ID: Christian Baird, male    DOB: 24-Jul-1955  Age: 66 y.o. MRN: [id number]  Chief Complaint  Patient presents with   Follow-up    Follow up on BP, pt states that home readings have been up and down.     HPI follow-up of hypertension, hyperkalemia and CKD.  Aldactone was discontinued.  His blood pressures been running mostly in the 120s to 130s over 70s range.  Has occasional spikes into the 150/70.  Has been hydrating well.    Review of Systems  Constitutional: Negative.   HENT: Negative.    Eyes:  Negative for blurred vision, discharge and redness.  Respiratory: Negative.    Cardiovascular: Negative.   Gastrointestinal:  Negative for abdominal pain.  Genitourinary: Negative.   Musculoskeletal: Negative.  Negative for myalgias.  Skin:  Negative for rash.  Neurological:  Negative for tingling, loss of consciousness and weakness.  Endo/Heme/Allergies:  Negative for polydipsia.      Objective:     BP 138/70 (BP Location: Right Arm, Patient Position: Sitting, Cuff Size: Normal)   Pulse (!) 55   Temp 98 F (36.7 C) (Temporal)   Ht 5\' 5"  (1.651 m)   Wt 177 lb 12.8 oz (80.6 kg)   SpO2 98%   BMI 29.59 kg/m  Wt Readings from Last 3 Encounters:  09/21/21 177 lb 12.8 oz (80.6 kg)  07/25/21 174 lb 9.6 oz (79.2 kg)  09/21/20 177 lb 6.4 oz (80.5 kg)      Physical Exam Constitutional:      General: He is not in acute distress.    Appearance: Normal appearance. He is not ill-appearing, toxic-appearing or diaphoretic.  HENT:     Head: Normocephalic and atraumatic.     Right Ear: External ear normal.     Left Ear: External ear normal.  Eyes:     General: No scleral icterus.       Right eye: No discharge.        Left eye: No discharge.     Extraocular Movements: Extraocular movements intact.     Conjunctiva/sclera: Conjunctivae normal.  Pulmonary:     Effort: Pulmonary effort is normal. No respiratory distress.   Musculoskeletal:     Cervical back: No rigidity or tenderness.  Skin:    General: Skin is warm and dry.  Neurological:     Mental Status: He is alert and oriented to person, place, and time.  Psychiatric:        Mood and Affect: Mood normal.        Behavior: Behavior normal.      Results for orders placed or performed in visit on 09/21/21  Basic metabolic panel  Result Value Ref Range   Sodium 139 135 - 145 mEq/L   Potassium 3.7 3.5 - 5.1 mEq/L   Chloride 101 96 - 112 mEq/L   CO2 30 19 - 32 mEq/L   Glucose, Bld 81 70 - 99 mg/dL   BUN 23 6 - 23 mg/dL   Creatinine, Ser 11/21/21 (H) 0.40 - 1.50 mg/dL   GFR 2.95 (L) 28.41 mL/min   Calcium 9.1 8.4 - 10.5 mg/dL      The ASCVD Risk score (Arnett DK, et al., 2019) failed to calculate for the following reasons:   The patient has a prior MI or stroke diagnosis    Assessment & Plan:   Problem List Items Addressed This Visit       Cardiovascular and  Mediastinum   Essential hypertension   Relevant Medications   spironolactone (ALDACTONE) 25 MG tablet   Other Relevant Orders   Basic metabolic panel (Completed)     Genitourinary   Stage 3b chronic kidney disease (HCC) - Primary   Relevant Orders   Basic metabolic panel (Completed)     Other   Hyperkalemia   Relevant Orders   Basic metabolic panel (Completed)    Return in about 6 weeks (around 11/02/2021), or Continue current therapy.  Check blood pressures periodically.Mliss Sax, MD  8/7 addendum: BP elevated at home. Will add aldactone back and keep close check on potassium.

## 2021-09-22 NOTE — Progress Notes (Signed)
Cardiology Office Note:   Date:  09/26/2021  NAME:  Christian Baird    MRN: [id number] DOB:  1955/06/14   PCP:  Mliss Sax, MD  Cardiologist:  Reatha Harps, MD  Electrophysiologist:  None   Referring MD: Mliss Sax,*   Chief Complaint  Patient presents with   Follow-up         History of Present Illness:   Christian Baird is a 66 y.o. male with a hx of CAD, hypertension, hyperlipidemia who presents for follow-up.  He reports he is doing well.  Denies chest pain or trouble breathing.  Walking for up to 1 hour 3 times per week.  [place] [employer]  Blood pressure has been a bit elevated recently.  Primary care physician did start Aldactone.  He did have an AKI.  He also had hyperkalemia.  Aldactone was stopped.  We discussed starting hydralazine if blood pressure remains persistently elevated.  BP values seems to be at goal today.  His EKG is normal.  His cholesterol level is at goal.  He overall is doing well.  He has plans to continue [place] for 1 more year.  He reports he will retire and moved to the [place] with his wife.  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  Overall seems to be doing well from a heart standpoint.  Problem List 1. NSTEMI 07/29/2019 -DES to dRCA 2. HTN 3. HLD -T chol 107, HDL 33, LDL 60, triglycerides 66  Past Medical History: Past Medical History:  Diagnosis Date   Coronary artery disease    GERD (gastroesophageal reflux disease)    Hypertension    Hypothyroidism     Past Surgical History: Past Surgical History:  Procedure Laterality Date   ABDOMINAL SURGERY     CORONARY STENT INTERVENTION N/A 07/29/2019   Procedure: CORONARY STENT INTERVENTION;  Surgeon: Swaziland, Peter M, MD;  Location: [facility] INVASIVE CV LAB;  Service: Cardiovascular;  Laterality: N/A;   FRACTURE SURGERY     LEFT HEART CATH AND CORONARY ANGIOGRAPHY N/A 07/29/2019   Procedure: LEFT HEART CATH AND  CORONARY ANGIOGRAPHY;  Surgeon: Swaziland, Peter M, MD;  Location: [facility] INVASIVE CV LAB;  Service: Cardiovascular;  Laterality: N/A;    Current Medications: Current Meds  Medication Sig   aspirin 81 MG EC tablet Take 1 tablet (81 mg total) by mouth daily.   atorvastatin (LIPITOR) 80 MG tablet Take 1 tablet (80 mg total) by mouth daily.   doxepin (SINEQUAN) 10 MG capsule TAKE ONE CAPSULE BY MOUTH ONE TO TWO TIMES DAILY AS NEEDED   hydrALAZINE (APRESOLINE) 25 MG tablet Take 1 tablet (25 mg total) by mouth 3 (three) times daily.   hydrochlorothiazide (HYDRODIURIL) 25 MG tablet Take 1 tablet (25 mg total) by mouth daily.   hydrOXYzine (ATARAX) 10 MG tablet TAKE ONE TABLET BY MOUTH THREE TIMES DAILY AS NEEDED   levothyroxine (SYNTHROID) 75 MCG tablet TAKE ONE TABLET BY MOUTH ONE TIME DAILY 0600   lisinopril (ZESTRIL) 40 MG tablet TAKE ONE TABLET BY MOUTH ONE TIME DAILY   metoprolol succinate (TOPROL-XL) 50 MG 24 hr tablet TAKE ONE TABLET BY MOUTH ONE TIME DAILY with or immediately following a meal   pantoprazole (PROTONIX) 40 MG tablet Take 1 tablet (40 mg total) by mouth daily.   sildenafil (REVATIO) 20 MG tablet May take 3-5 as needed 45 minutes prior to intercourse.   [DISCONTINUED] spironolactone (ALDACTONE) 12.5 mg TABS  tablet Take 12.5 mg by mouth daily.     Allergies:    Patient has no known allergies.   Social History: Social History   Socioeconomic History   Marital status: Married    Spouse name: Not on file   Number of children: Not on file   Years of education: Not on file   Highest education level: Not on file  Occupational History   Occupation: [employer]  Tobacco Use   Smoking status: Never   Smokeless tobacco: Never  Vaping Use   Vaping Use: Never used  Substance and Sexual Activity   Alcohol use: Never    Alcohol/week: 0.0 standard drinks of alcohol   Drug use: Never   Sexual activity: Yes  Other Topics Concern   Not on file  Social History Narrative   Not  on file   Social Determinants of Health   Financial Resource Strain: Not on file  Food Insecurity: Not on file  Transportation Needs: Not on file  Physical Activity: Not on file  Stress: Not on file  Social Connections: Not on file     Family History: The patient's family history includes Diabetes in his father; Hypertension in his mother.  ROS:   All other ROS reviewed and negative. Pertinent positives noted in the HPI.     EKGs/Labs/Other Studies Reviewed:   The following studies were personally reviewed by me today:  EKG:  EKG is ordered today.  The ekg ordered today demonstrates sinus bradycardia heart rate 52, no acute ischemic changes or evidence of infarction, and was personally reviewed by me.   LHC 07/29/2019 1. Single vessel obstructive CAD 2. Normal LV function 3. Normal LVEDP 4. Successful PCI of the distal RCA with DES x 1.  Recent Labs: 07/25/2021: ALT 50; Hemoglobin 14.0; Platelets 239.0; TSH 1.92 09/21/2021: BUN 23; Creatinine, Ser 1.63; Potassium 3.7; Sodium 139   Recent Lipid Panel    Component Value Date/Time   CHOL 107 07/25/2021 0949   CHOL 111 10/08/2019 0843   TRIG 66.0 07/25/2021 0949   HDL 33.80 (L) 07/25/2021 0949   HDL 33 (L) 10/08/2019 0843   CHOLHDL 3 07/25/2021 0949   VLDL 13.2 07/25/2021 0949   LDLCALC 60 07/25/2021 0949   LDLCALC 65 10/08/2019 0843    Physical Exam:   VS:  BP 134/80   Pulse (!) 52   Ht 5\' 6"  (1.676 m)   Wt 179 lb (81.2 kg)   SpO2 99%   BMI 28.89 kg/m    Wt Readings from Last 3 Encounters:  09/26/21 179 lb (81.2 kg)  09/21/21 177 lb 12.8 oz (80.6 kg)  07/25/21 174 lb 9.6 oz (79.2 kg)    General: Well nourished, well developed, in no acute distress Head: Atraumatic, normal size  Eyes: PEERLA, EOMI  Neck: Supple, no JVD Endocrine: No thryomegaly Cardiac: Normal S1, S2; RRR; no murmurs, rubs, or gallops Lungs: Clear to auscultation bilaterally, no wheezing, rhonchi or rales  Abd: Soft, nontender, no hepatomegaly   Ext: No edema, pulses 2+ Musculoskeletal: No deformities, BUE and BLE strength normal and equal Skin: Warm and dry, no rashes   Neuro: Alert and oriented to person, place, time, and situation, CNII-XII grossly intact, no focal deficits  Psych: Normal mood and affect   ASSESSMENT:   Christian Baird is a 66 y.o. male who presents for the following: 1. Coronary artery disease involving native coronary artery of native heart without angina pectoris   2. Mixed hyperlipidemia   3. Primary  hypertension     PLAN:   1. Coronary artery disease involving native coronary artery of native heart without angina pectoris 2. Mixed hyperlipidemia -History of non-STEMI.  Underwent drug-eluting stent to the distal RCA.  No further chest pain episodes.  He is dieting well.  He is exercising well.  He will continue aspirin 81 mg daily as well as Lipitor 80 mg daily.  LDL cholesterol at goal.  EKG is normal.  CV exam is normal.  3. Primary hypertension -Has had spikes in blood pressure recently.  Currently on metoprolol succinate 25 mg daily, lisinopril 40 mg daily, HCTZ 25 mg daily.  Had an AKI on Aldactone.  Given his CKD stage IIIa I do not believe Aldactone is the best option for him.  We discussed starting hydralazine.  He is okay to do this.  He will let us know if values are not at goal.  He will work on salt reduction as well as continued exercise.  Disposition: Return in about 1 year (around 09/27/2022).  Medication Adjustments/Labs and Tests Ordered: Current medicines are reviewed at length with the patient today.  Concerns regarding medicines are outlined above.  Orders Placed This Encounter  Procedures   EKG 12-Lead   Meds ordered this encounter  Medications   hydrALAZINE (APRESOLINE) 25 MG tablet    Sig: Take 1 tablet (25 mg total) by mouth 3 (three) times daily.    Dispense:  270 tablet    Refill:  3    Patient Instructions  Medication Instructions:  STOP Aldactone  START Hydralazine  25 mg three times daily- if over the next three days your blood pressures are above 140 consistently.  *If you need a refill on your cardiac medications before your next appointment, please call your pharmacy*   Follow-Up: At [facility], you and your health needs are our priority.  As part of our continuing mission to provide you with exceptional heart care, we have created designated Provider Care Teams.  These Care Teams include your primary Cardiologist (physician) and Advanced Practice Providers (APPs -  Physician Assistants and Nurse Practitioners) who all work together to provide you with the care you need, when you need it.  We recommend signing up for the patient portal called "MyChart".  Sign up information is provided on this After Visit Summary.  MyChart is used to connect with patients for Virtual Visits (Telemedicine).  Patients are able to view lab/test results, encounter notes, upcoming appointments, etc.  Non-urgent messages can be sent to your provider as well.   To learn more about what you can do with MyChart, go to [email or web address].    Your next appointment:   12 month(s)  The format for your next appointment:   In Person  Provider:   Reatha Harps, MD             Time Spent with Patient: I have spent a total of 25 minutes with patient reviewing hospital notes, telemetry, EKGs, labs and examining the patient as well as establishing an assessment and plan that was discussed with the patient.  > 50% of time was spent in direct patient care.  Signed, Lenna Gilford. Flora Lipps, MD, [facility]  [address] [place], [place] [address] [phone]  09/26/2021 9:03 AM

## 2021-09-25 ENCOUNTER — Encounter: Payer: Self-pay | Admitting: Family Medicine

## 2021-09-25 MED ORDER — SPIRONOLACTONE 25 MG PO TABS: 12.5000 mg | ORAL_TABLET | Freq: Every day | ORAL | 11 refills | Status: DC

## 2021-09-25 NOTE — Addendum Note (Signed)
Addended by: Andrez Grime on: 09/25/2021 05:06 PM   Modules accepted: Orders

## 2021-09-26 ENCOUNTER — Ambulatory Visit: Payer: BC Managed Care – PPO | Admitting: Cardiovascular Disease

## 2021-09-26 ENCOUNTER — Encounter: Payer: Self-pay | Admitting: Cardiovascular Disease

## 2021-09-26 VITALS — BP 134/80 | HR 52 | Ht 66.0 in | Wt 179.0 lb

## 2021-09-26 DIAGNOSIS — I10 Essential (primary) hypertension: Secondary | ICD-10-CM | POA: Diagnosis not present

## 2021-09-26 DIAGNOSIS — I2510 Atherosclerotic heart disease of native coronary artery without angina pectoris: Secondary | ICD-10-CM | POA: Diagnosis not present

## 2021-09-26 DIAGNOSIS — E782 Mixed hyperlipidemia: Secondary | ICD-10-CM | POA: Diagnosis not present

## 2021-09-26 MED ORDER — HYDRALAZINE HCL 25 MG PO TABS: 25.0000 mg | ORAL_TABLET | Freq: Three times a day (TID) | ORAL | 3 refills | Status: DC

## 2021-09-26 NOTE — Patient Instructions (Signed)
Medication Instructions:  STOP Aldactone  START Hydralazine 25 mg three times daily- if over the next three days your blood pressures are above 140 consistently.  *If you need a refill on your cardiac medications before your next appointment, please call your pharmacy*   Follow-Up: At [facility], you and your health needs are our priority.  As part of our continuing mission to provide you with exceptional heart care, we have created designated Provider Care Teams.  These Care Teams include your primary Cardiologist (physician) and Advanced Practice Providers (APPs -  Physician Assistants and Nurse Practitioners) who all work together to provide you with the care you need, when you need it.  We recommend signing up for the patient portal called "MyChart".  Sign up information is provided on this After Visit Summary.  MyChart is used to connect with patients for Virtual Visits (Telemedicine).  Patients are able to view lab/test results, encounter notes, upcoming appointments, etc.  Non-urgent messages can be sent to your provider as well.   To learn more about what you can do with MyChart, go to [email or web address].    Your next appointment:   12 month(s)  The format for your next appointment:   In Person  Provider:   Reatha Harps, MD

## 2021-10-03 ENCOUNTER — Other Ambulatory Visit: Payer: Self-pay | Admitting: Family Medicine

## 2021-10-03 DIAGNOSIS — N5201 Erectile dysfunction due to arterial insufficiency: Secondary | ICD-10-CM

## 2021-10-24 ENCOUNTER — Other Ambulatory Visit: Payer: Self-pay | Admitting: Family Medicine

## 2021-10-24 DIAGNOSIS — I10 Essential (primary) hypertension: Secondary | ICD-10-CM

## 2021-11-26 ENCOUNTER — Other Ambulatory Visit: Payer: Self-pay | Admitting: Family Medicine

## 2021-11-26 DIAGNOSIS — I10 Essential (primary) hypertension: Secondary | ICD-10-CM

## 2022-01-01 ENCOUNTER — Ambulatory Visit: Payer: BC Managed Care – PPO | Admitting: Cardiovascular Disease

## 2022-01-22 ENCOUNTER — Other Ambulatory Visit: Payer: Self-pay | Admitting: Family Medicine

## 2022-01-22 DIAGNOSIS — I10 Essential (primary) hypertension: Secondary | ICD-10-CM

## 2022-02-05 ENCOUNTER — Other Ambulatory Visit: Payer: Self-pay | Admitting: Family Medicine

## 2022-02-05 DIAGNOSIS — E039 Hypothyroidism, unspecified: Secondary | ICD-10-CM

## 2022-02-22 ENCOUNTER — Other Ambulatory Visit: Payer: Self-pay | Admitting: Family Medicine

## 2022-02-22 DIAGNOSIS — N5201 Erectile dysfunction due to arterial insufficiency: Secondary | ICD-10-CM

## 2022-02-27 ENCOUNTER — Other Ambulatory Visit: Payer: Self-pay | Admitting: Family Medicine

## 2022-02-27 ENCOUNTER — Encounter: Payer: Self-pay | Admitting: Family Medicine

## 2022-02-27 DIAGNOSIS — I10 Essential (primary) hypertension: Secondary | ICD-10-CM

## 2022-03-05 IMAGING — CR DG CHEST 2V
2 series · 2 of 2 positions shown · non-contrast
Comparison: None.

CLINICAL DATA: Chest pain

EXAM:
CHEST - 2 VIEW

[w chest pa]
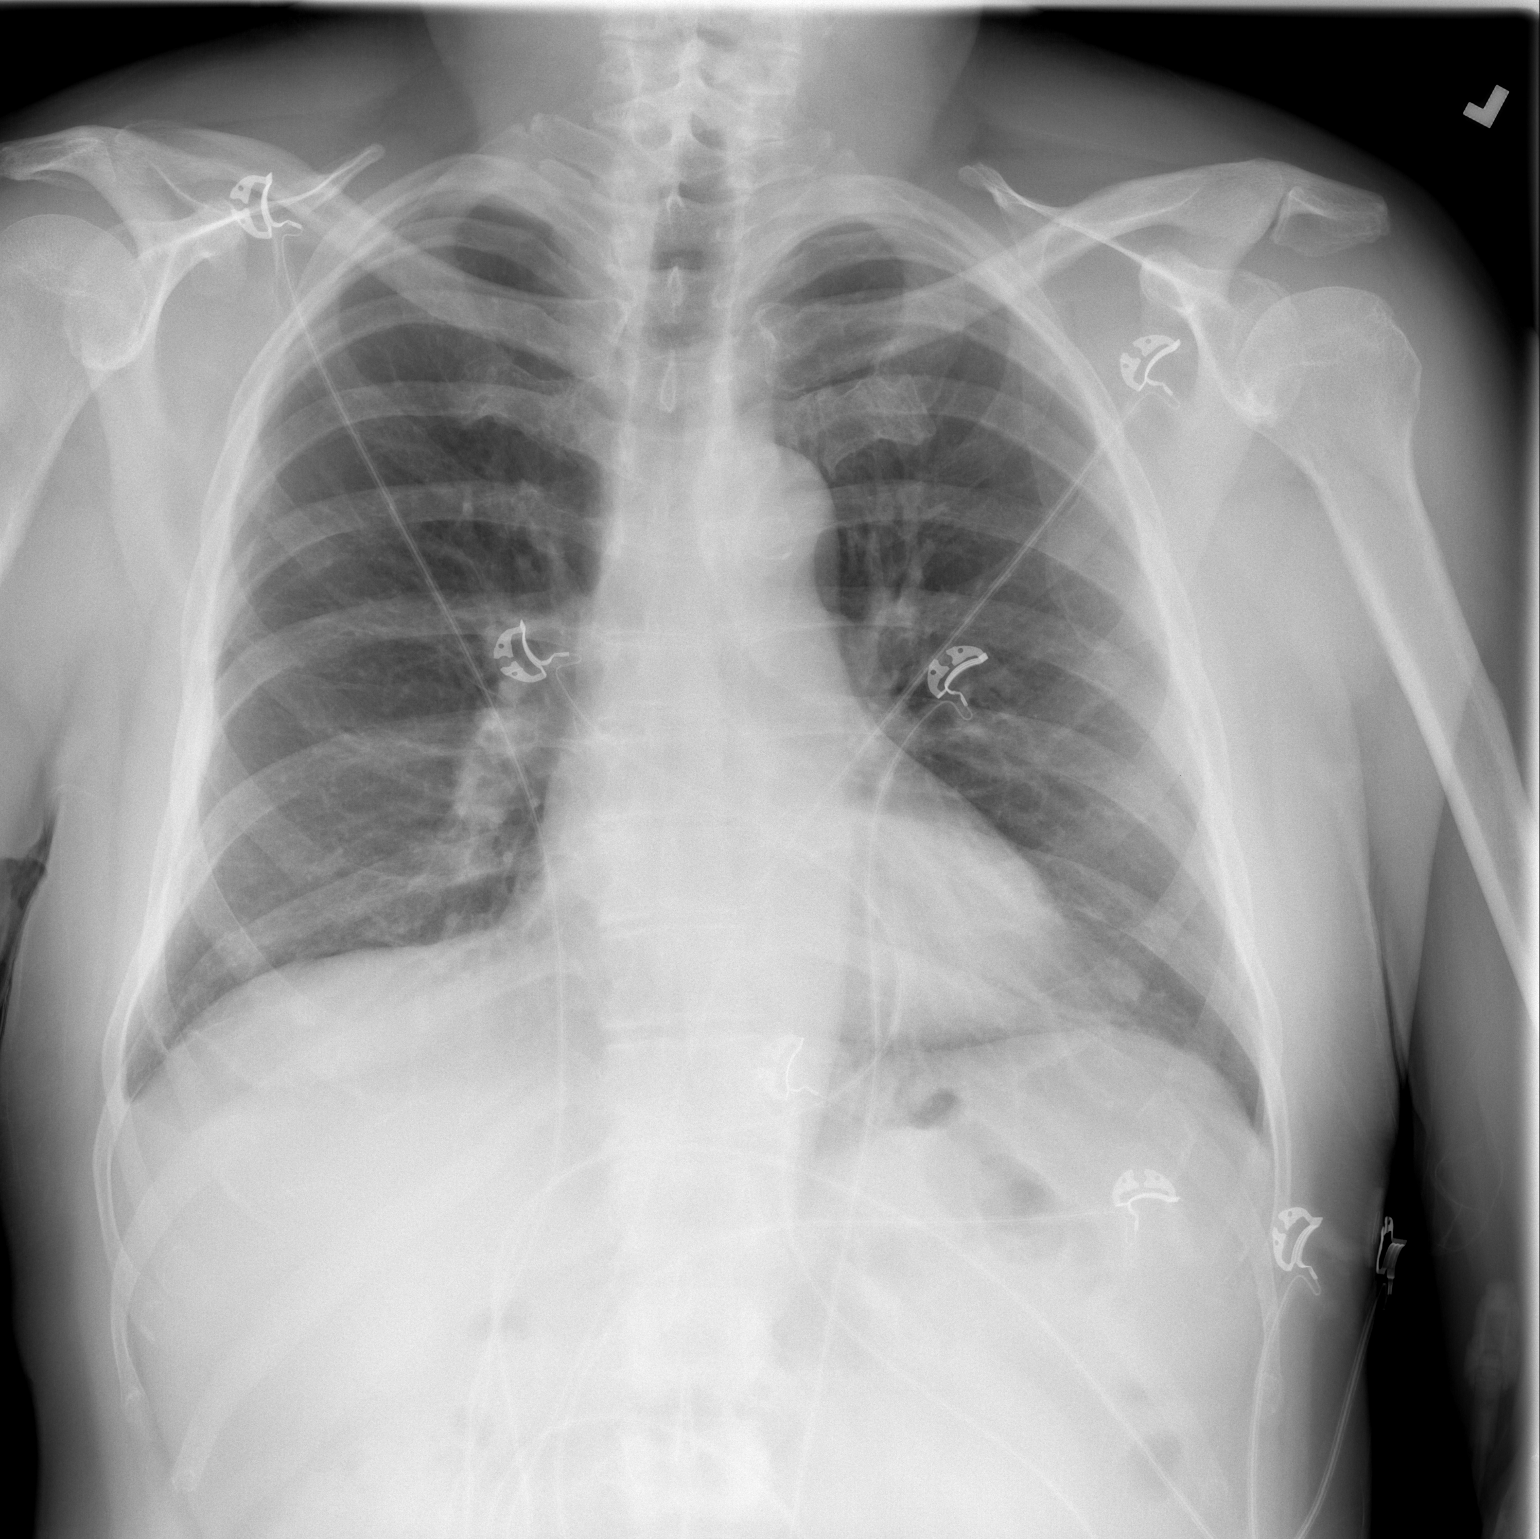

[w chest lat]
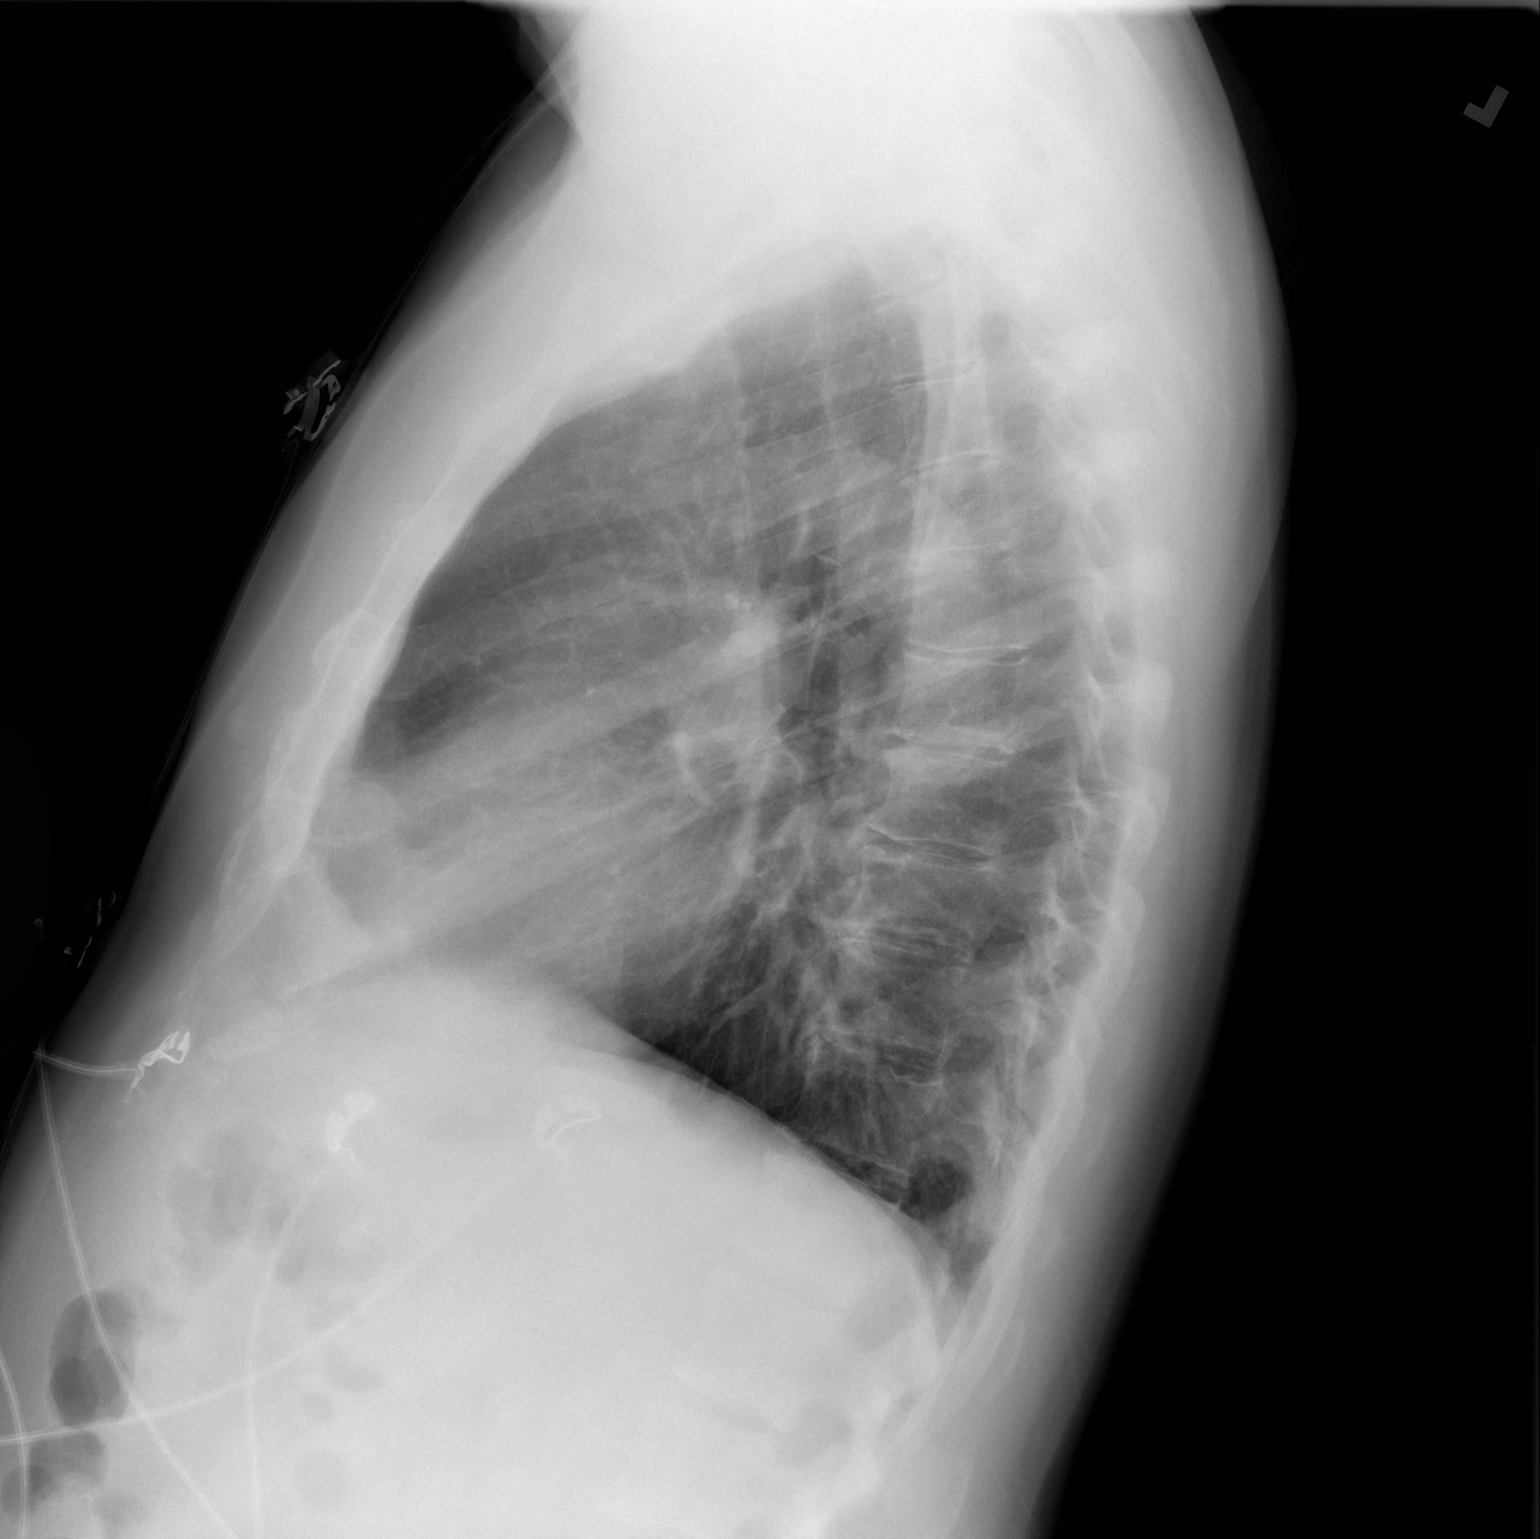

[2 of 2 positions shown; findings below may reference images not displayed]

FINDINGS: The heart size and mediastinal contours are within normal limits.
Both lungs are clear. The visualized skeletal structures are
unremarkable.
IMPRESSION: No active cardiopulmonary disease.

## 2022-03-06 ENCOUNTER — Other Ambulatory Visit: Payer: Self-pay

## 2022-03-06 ENCOUNTER — Ambulatory Visit: Payer: BC Managed Care – PPO | Admitting: Family Medicine

## 2022-03-06 DIAGNOSIS — I10 Essential (primary) hypertension: Secondary | ICD-10-CM

## 2022-03-06 MED ORDER — METOPROLOL SUCCINATE ER 50 MG PO TB24: ORAL_TABLET | ORAL | 0 refills | Status: DC

## 2022-03-13 ENCOUNTER — Encounter: Payer: Self-pay | Admitting: Family Medicine

## 2022-03-13 ENCOUNTER — Ambulatory Visit: Payer: BC Managed Care – PPO | Admitting: Family Medicine

## 2022-03-13 VITALS — BP 170/87 | HR 61 | Temp 98.0°F | Ht 66.0 in | Wt 179.4 lb

## 2022-03-13 DIAGNOSIS — I10 Essential (primary) hypertension: Secondary | ICD-10-CM | POA: Diagnosis not present

## 2022-03-13 MED ORDER — CHLORTHALIDONE 25 MG PO TABS: 25.0000 mg | ORAL_TABLET | Freq: Every day | ORAL | 1 refills | Status: DC

## 2022-03-13 MED ORDER — CARVEDILOL 6.25 MG PO TABS: 6.2500 mg | ORAL_TABLET | Freq: Two times a day (BID) | ORAL | 3 refills | Status: DC

## 2022-03-13 NOTE — Progress Notes (Signed)
Established Patient Office Visit   Subjective:  Patient ID: Christian Baird, male    DOB: 1955/10/11  Age: 67 y.o. MRN: [id number]  Chief Complaint  Patient presents with   Follow-up    Follow up on BP, elevated at home.    HPI Encounter Diagnoses  Name Primary?   Hypertension, uncontrolled Yes   For follow-up of elevated blood pressure he has been experiencing over the last 4 to 6 weeks.  Blood pressures at home and been running as above.  He has been compliant with his current regimen of metoprolol XL 50 mg, hydralazine 25 mg 3 times daily, HCTZ milligrams and lisinopril 40 mg daily.  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  He does not smoke or drink alcohol.  He has been mindful of his sodium intake.  He denies headaches but feels a heaviness in his head when his blood pressure is elevated.  He is tentatively planning on retiring at the end of July.   Review of Systems  Constitutional: Negative.   HENT: Negative.    Eyes:  Negative for blurred vision, discharge and redness.  Respiratory: Negative.    Cardiovascular: Negative.   Gastrointestinal:  Negative for abdominal pain.  Genitourinary: Negative.   Musculoskeletal: Negative.  Negative for myalgias.  Skin:  Negative for rash.  Neurological:  Negative for tingling, loss of consciousness, weakness and headaches.  Endo/Heme/Allergies:  Negative for polydipsia.     Current Outpatient Medications:    aspirin 81 MG EC tablet, Take 1 tablet (81 mg total) by mouth daily., Disp: 90 tablet, Rfl: 3   atorvastatin (LIPITOR) 80 MG tablet, Take 1 tablet (80 mg total) by mouth daily., Disp: 90 tablet, Rfl: 3   carvedilol (COREG) 6.25 MG tablet, Take 1 tablet (6.25 mg total) by mouth 2 (two) times daily with a meal., Disp: 60 tablet, Rfl: 3   chlorthalidone (HYGROTON) 25 MG tablet, Take 1 tablet (25 mg total) by mouth daily., Disp: 90 tablet, Rfl: 1   doxepin (SINEQUAN) 10 MG capsule, TAKE ONE CAPSULE BY  MOUTH ONE TO TWO TIMES DAILY AS NEEDED, Disp: 60 capsule, Rfl: 0   hydrALAZINE (APRESOLINE) 25 MG tablet, Take 1 tablet (25 mg total) by mouth 3 (three) times daily., Disp: 270 tablet, Rfl: 3   hydrOXYzine (ATARAX) 10 MG tablet, TAKE ONE TABLET BY MOUTH THREE TIMES DAILY AS NEEDED, Disp: 30 tablet, Rfl: 6   levothyroxine (SYNTHROID) 75 MCG tablet, TAKE ONE TABLET BY MOUTH ONCE A DAY AT 0600, Disp: 90 tablet, Rfl: 1   lisinopril (ZESTRIL) 40 MG tablet, TAKE ONE TABLET BY MOUTH ONE TIME DAILY, Disp: 90 tablet, Rfl: 0   pantoprazole (PROTONIX) 40 MG tablet, Take 1 tablet (40 mg total) by mouth daily., Disp: 90 tablet, Rfl: 3   sildenafil (REVATIO) 20 MG tablet, take three to five tablets by mouth as needed 45 minutes prior to intercourse, Disp: 50 tablet, Rfl: 0   Objective:     BP (!) 170/87 (BP Location: Right Arm, Patient Position: Sitting, Cuff Size: Normal)   Pulse 61   Temp 98 F (36.7 C) (Temporal)   Ht 5\' 6"  (1.676 m)   Wt 179 lb 6.4 oz (81.4 kg)   SpO2 99%   BMI 28.96 kg/m  BP Readings from Last 3 Encounters:  03/13/22 (!) 170/87  09/26/21 134/80  09/21/21 138/70   Wt Readings from Last 3 Encounters:  03/13/22 179 lb 6.4 oz (81.4 kg)  09/26/21 179  lb (81.2 kg)  09/21/21 177 lb 12.8 oz (80.6 kg)      Physical Exam Constitutional:      General: He is not in acute distress.    Appearance: Normal appearance. He is not ill-appearing, toxic-appearing or diaphoretic.  HENT:     Head: Normocephalic and atraumatic.     Right Ear: External ear normal.     Left Ear: External ear normal.     Mouth/Throat:     Mouth: Mucous membranes are moist.     Pharynx: Oropharynx is clear. No oropharyngeal exudate or posterior oropharyngeal erythema.  Eyes:     General: No scleral icterus.       Right eye: No discharge.        Left eye: No discharge.     Extraocular Movements: Extraocular movements intact.     Conjunctiva/sclera: Conjunctivae normal.     Pupils: Pupils are equal, round,  and reactive to light.  Cardiovascular:     Rate and Rhythm: Normal rate and regular rhythm.  Pulmonary:     Effort: Pulmonary effort is normal. No respiratory distress.     Breath sounds: Normal breath sounds. No wheezing or rales.  Musculoskeletal:     Cervical back: No rigidity or tenderness.  Skin:    General: Skin is warm and dry.  Neurological:     Mental Status: He is alert and oriented to person, place, and time.  Psychiatric:        Mood and Affect: Mood normal.        Behavior: Behavior normal.      No results found for any visits on 03/13/22.    The ASCVD Risk score (Arnett DK, et al., 2019) failed to calculate for the following reasons:   The patient has a prior MI or stroke diagnosis    Assessment & Plan:   Hypertension, uncontrolled -     EKG 12-Lead -     Carvedilol; Take 1 tablet (6.25 mg total) by mouth 2 (two) times daily with a meal.  Dispense: 60 tablet; Refill: 3 -     Chlorthalidone; Take 1 tablet (25 mg total) by mouth daily.  Dispense: 90 tablet; Refill: 1 -     Basic metabolic panel    Return in about 6 weeks (around 04/24/2022).  Have discontinued HCTZ and metoprolol.  Will start carvedilol 6.25 twice daily and chlorthalidone 25 every morning.  Hope to take advantage of the additional blood pressure lowering effect of carvedilol as well as the full 24-hour coverage with chlorthalidone. continue hydralazine 25 3 times daily and lisinopril 40 mg daily.  EKG shows normal sinus rhythm without ST segment elevations or depressions.  There are no flipped T's. Above changes made. Libby Maw, MD

## 2022-03-14 LAB — BASIC METABOLIC PANEL
BUN: 29 mg/dL — ABNORMAL HIGH (ref 6–23)
CO2: 30 mEq/L (ref 19–32)
Calcium: 9.1 mg/dL (ref 8.4–10.5)
Chloride: 101 mEq/L (ref 96–112)
Creatinine, Ser: 1.61 mg/dL — ABNORMAL HIGH (ref 0.40–1.50)
GFR: 44.92 mL/min — ABNORMAL LOW (ref >60.00)
Glucose, Bld: 78 mg/dL (ref 70–99)
Potassium: 3.8 mEq/L (ref 3.5–5.1)
Sodium: 140 mEq/L (ref 135–145)

## 2022-03-21 ENCOUNTER — Other Ambulatory Visit: Payer: Self-pay | Admitting: Family Medicine

## 2022-03-21 DIAGNOSIS — K219 Gastro-esophageal reflux disease without esophagitis: Secondary | ICD-10-CM

## 2022-04-30 ENCOUNTER — Other Ambulatory Visit: Payer: Self-pay | Admitting: Cardiovascular Disease

## 2022-04-30 ENCOUNTER — Other Ambulatory Visit: Payer: Self-pay | Admitting: Family Medicine

## 2022-04-30 DIAGNOSIS — F419 Anxiety disorder, unspecified: Secondary | ICD-10-CM

## 2022-05-25 ENCOUNTER — Other Ambulatory Visit: Payer: Self-pay | Admitting: Family Medicine

## 2022-05-25 DIAGNOSIS — I10 Essential (primary) hypertension: Secondary | ICD-10-CM

## 2022-05-29 ENCOUNTER — Other Ambulatory Visit: Payer: Self-pay | Admitting: Family Medicine

## 2022-05-29 DIAGNOSIS — I10 Essential (primary) hypertension: Secondary | ICD-10-CM

## 2022-06-04 ENCOUNTER — Other Ambulatory Visit: Payer: Self-pay | Admitting: Family Medicine

## 2022-06-04 DIAGNOSIS — K219 Gastro-esophageal reflux disease without esophagitis: Secondary | ICD-10-CM

## 2022-06-11 ENCOUNTER — Other Ambulatory Visit: Payer: Self-pay | Admitting: Family Medicine

## 2022-06-11 DIAGNOSIS — E785 Hyperlipidemia, unspecified: Secondary | ICD-10-CM

## 2022-07-03 ENCOUNTER — Other Ambulatory Visit: Payer: Self-pay | Admitting: Family Medicine

## 2022-07-03 DIAGNOSIS — I10 Essential (primary) hypertension: Secondary | ICD-10-CM

## 2022-07-06 ENCOUNTER — Other Ambulatory Visit: Payer: Self-pay | Admitting: Family Medicine

## 2022-07-06 DIAGNOSIS — N5201 Erectile dysfunction due to arterial insufficiency: Secondary | ICD-10-CM

## 2022-07-09 ENCOUNTER — Ambulatory Visit: Payer: BC Managed Care – PPO | Admitting: Family Medicine

## 2022-07-09 ENCOUNTER — Encounter: Payer: Self-pay | Admitting: Family Medicine

## 2022-07-09 VITALS — BP 140/72 | HR 77 | Temp 99.2°F | Ht 66.0 in | Wt 178.0 lb

## 2022-07-09 DIAGNOSIS — N1832 Chronic kidney disease, stage 3b: Secondary | ICD-10-CM

## 2022-07-09 DIAGNOSIS — F419 Anxiety disorder, unspecified: Secondary | ICD-10-CM

## 2022-07-09 DIAGNOSIS — J069 Acute upper respiratory infection, unspecified: Secondary | ICD-10-CM | POA: Insufficient documentation

## 2022-07-09 DIAGNOSIS — E785 Hyperlipidemia, unspecified: Secondary | ICD-10-CM

## 2022-07-09 DIAGNOSIS — I10 Essential (primary) hypertension: Secondary | ICD-10-CM

## 2022-07-09 DIAGNOSIS — U071 COVID-19: Secondary | ICD-10-CM

## 2022-07-09 DIAGNOSIS — K219 Gastro-esophageal reflux disease without esophagitis: Secondary | ICD-10-CM

## 2022-07-09 DIAGNOSIS — E039 Hypothyroidism, unspecified: Secondary | ICD-10-CM | POA: Diagnosis not present

## 2022-07-09 DIAGNOSIS — N5201 Erectile dysfunction due to arterial insufficiency: Secondary | ICD-10-CM

## 2022-07-09 LAB — POC COVID19 BINAXNOW: SARS Coronavirus 2 Ag: POSITIVE — AB

## 2022-07-09 LAB — POCT RAPID STREP A (OFFICE): Rapid Strep A Screen: NEGATIVE

## 2022-07-09 MED ORDER — DOXEPIN HCL 10 MG PO CAPS
ORAL_CAPSULE | ORAL | 0 refills | Status: AC
Start: 2022-07-09 — End: ?

## 2022-07-09 MED ORDER — LISINOPRIL 40 MG PO TABS: 40.0000 mg | ORAL_TABLET | Freq: Every day | ORAL | 0 refills | Status: DC

## 2022-07-09 MED ORDER — CARVEDILOL 6.25 MG PO TABS: 6.2500 mg | ORAL_TABLET | Freq: Two times a day (BID) | ORAL | 0 refills | Status: DC

## 2022-07-09 MED ORDER — SILDENAFIL CITRATE 20 MG PO TABS: ORAL_TABLET | ORAL | 0 refills | Status: DC

## 2022-07-09 MED ORDER — ATORVASTATIN CALCIUM 80 MG PO TABS: 80.0000 mg | ORAL_TABLET | Freq: Every day | ORAL | 0 refills | Status: DC

## 2022-07-09 MED ORDER — CHLORTHALIDONE 25 MG PO TABS: 25.0000 mg | ORAL_TABLET | Freq: Every day | ORAL | 1 refills | Status: DC

## 2022-07-09 MED ORDER — LEVOTHYROXINE SODIUM 75 MCG PO TABS: ORAL_TABLET | ORAL | 1 refills | Status: DC

## 2022-07-09 MED ORDER — PANTOPRAZOLE SODIUM 40 MG PO TBEC: 40.0000 mg | DELAYED_RELEASE_TABLET | Freq: Every day | ORAL | 0 refills | Status: DC

## 2022-07-09 NOTE — Progress Notes (Signed)
Established Patient Office Visit   Subjective:  Patient ID: Christian Baird, male    DOB: 1955/12/27  Age: 67 y.o. MRN: [id number]  Chief Complaint  Patient presents with   Medical Management of Chronic Issues    Routine follow up on medications. Patient states that he started with a scratchy throat today and low grade temp and body aches.     HPI Encounter Diagnoses  Name Primary?   Upper respiratory tract infection, unspecified type Yes   Hyperlipidemia, unspecified hyperlipidemia type    Anxiety    Acquired hypothyroidism    Gastroesophageal reflux disease, unspecified whether esophagitis present    Erectile dysfunction due to arterial insufficiency    Essential hypertension    Stage 3b chronic kidney disease (HCC)    COVID-19    For follow-up of.  Blood pressure under much better control.  Has been running in the 120s to 130s over 70s to 80s.  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  Continues with levothyroxine each morning on a fasting stomach.  Continues Protonix for GERD.  1 day history of headache, elevated temperature, myalgias, scratchy throat.  Denies wheezing or difficulty breathing, nausea or vomiting.   Review of Systems  Constitutional: Negative.   HENT:  Positive for congestion and sore throat.   Eyes:  Negative for blurred vision, discharge and redness.  Respiratory:  Positive for cough. Negative for shortness of breath and wheezing.   Cardiovascular: Negative.   Gastrointestinal:  Negative for abdominal pain.  Genitourinary: Negative.   Musculoskeletal:  Positive for myalgias.  Skin:  Negative for rash.  Neurological:  Positive for headaches. Negative for tingling, loss of consciousness and weakness.  Endo/Heme/Allergies:  Negative for polydipsia.     Current Outpatient Medications:    aspirin 81 MG EC tablet, Take 1 tablet (81 mg total) by mouth daily., Disp: 90 tablet, Rfl: 3   hydrALAZINE (APRESOLINE) 25 MG tablet, Take 1 tablet (25 mg  total) by mouth 3 (three) times daily., Disp: 270 tablet, Rfl: 3   hydrOXYzine (ATARAX) 10 MG tablet, TAKE ONE TABLET BY MOUTH THREE TIMES DAILY AS NEEDED, Disp: 90 tablet, Rfl: 4   atorvastatin (LIPITOR) 80 MG tablet, Take 1 tablet (80 mg total) by mouth daily., Disp: 90 tablet, Rfl: 0   carvedilol (COREG) 6.25 MG tablet, Take 1 tablet (6.25 mg total) by mouth 2 (two) times daily with a meal., Disp: 60 tablet, Rfl: 0   chlorthalidone (HYGROTON) 25 MG tablet, Take 1 tablet (25 mg total) by mouth daily., Disp: 90 tablet, Rfl: 1   doxepin (SINEQUAN) 10 MG capsule, TAKE 1 CAPSULE BY MOUTH 1 TO 2 TIMES DAILY AS NEEDED, Disp: 60 capsule, Rfl: 0   levothyroxine (SYNTHROID) 75 MCG tablet, TAKE ONE TABLET BY MOUTH ONCE A DAY AT 0600, Disp: 90 tablet, Rfl: 1   lisinopril (ZESTRIL) 40 MG tablet, Take 1 tablet (40 mg total) by mouth daily., Disp: 90 tablet, Rfl: 0   pantoprazole (PROTONIX) 40 MG tablet, Take 1 tablet (40 mg total) by mouth daily., Disp: 90 tablet, Rfl: 0   sildenafil (REVATIO) 20 MG tablet, TAKE 3 TO 5 TABLETS BY MOUTH AS NEEDED 45 MINUTES PRIOR TO INTERCOURSE, Disp: 50 tablet, Rfl: 0   Objective:     BP (!) 140/72 (BP Location: Right Arm, Patient Position: Sitting, Cuff Size: Normal)   Pulse 77   Temp 99.2 F (37.3 C) (Temporal)   Ht 5\' 6"  (1.676 m)   Wt 178 lb (80.7  kg)   SpO2 98%   BMI 28.73 kg/m    Physical Exam Constitutional:      General: He is not in acute distress.    Appearance: Normal appearance. He is not ill-appearing, toxic-appearing or diaphoretic.  HENT:     Head: Normocephalic and atraumatic.     Right Ear: External ear normal.     Left Ear: External ear normal.     Mouth/Throat:     Mouth: Mucous membranes are moist.     Pharynx: Oropharynx is clear. Posterior oropharyngeal erythema present. No oropharyngeal exudate.  Eyes:     General: No scleral icterus.       Right eye: No discharge.        Left eye: No discharge.     Extraocular Movements:  Extraocular movements intact.     Conjunctiva/sclera: Conjunctivae normal.     Pupils: Pupils are equal, round, and reactive to light.  Cardiovascular:     Rate and Rhythm: Normal rate and regular rhythm.  Pulmonary:     Effort: Pulmonary effort is normal. No respiratory distress.     Breath sounds: Normal breath sounds. No wheezing, rhonchi or rales.  Abdominal:     General: Bowel sounds are normal.  Musculoskeletal:     Cervical back: No rigidity or tenderness.  Lymphadenopathy:     Cervical: No cervical adenopathy.  Skin:    General: Skin is warm and dry.  Neurological:     Mental Status: He is alert and oriented to person, place, and time.  Psychiatric:        Mood and Affect: Mood normal.        Behavior: Behavior normal.      Results for orders placed or performed in visit on 07/09/22  POCT rapid strep A  Result Value Ref Range   Rapid Strep A Screen Negative Negative  POC COVID-19  Result Value Ref Range   SARS Coronavirus 2 Ag Positive (A) Negative      The ASCVD Risk score (Arnett DK, et al., 2019) failed to calculate for the following reasons:   The patient has a prior MI or stroke diagnosis    Assessment & Plan:   Upper respiratory tract infection, unspecified type -     POCT rapid strep A -     POC COVID-19 BinaxNow  Hyperlipidemia, unspecified hyperlipidemia type -     Atorvastatin Calcium; Take 1 tablet (80 mg total) by mouth daily.  Dispense: 90 tablet; Refill: 0 -     Comprehensive metabolic panel; Future -     Lipid panel; Future  Anxiety -     Doxepin HCl; TAKE 1 CAPSULE BY MOUTH 1 TO 2 TIMES DAILY AS NEEDED  Dispense: 60 capsule; Refill: 0  Acquired hypothyroidism -     Levothyroxine Sodium; TAKE ONE TABLET BY MOUTH ONCE A DAY AT 0600  Dispense: 90 tablet; Refill: 1 -     TSH; Future  Gastroesophageal reflux disease, unspecified whether esophagitis present -     Pantoprazole Sodium; Take 1 tablet (40 mg total) by mouth daily.  Dispense: 90  tablet; Refill: 0  Erectile dysfunction due to arterial insufficiency -     Sildenafil Citrate; TAKE 3 TO 5 TABLETS BY MOUTH AS NEEDED 45 MINUTES PRIOR TO INTERCOURSE  Dispense: 50 tablet; Refill: 0  Essential hypertension -     Carvedilol; Take 1 tablet (6.25 mg total) by mouth 2 (two) times daily with a meal.  Dispense: 60 tablet; Refill: 0 -  Chlorthalidone; Take 1 tablet (25 mg total) by mouth daily.  Dispense: 90 tablet; Refill: 1 -     Lisinopril; Take 1 tablet (40 mg total) by mouth daily.  Dispense: 90 tablet; Refill: 0 -     CBC; Future -     Comprehensive metabolic panel; Future -     Urinalysis, Routine w reflex microscopic; Future  Stage 3b chronic kidney disease (HCC) -     Comprehensive metabolic panel; Future  COVID-19    Return in about 1 week (around 07/16/2022), or if symptoms worsen or fail to improve.  Return fasting for labs.  Recommended rest fluids, Tylenol and Advil as needed.  Return next week if not improving.  Consider emergency room evaluation if with any trouble breathing.  Mliss Sax, MD

## 2022-07-17 ENCOUNTER — Telehealth: Payer: Self-pay | Admitting: Family Medicine

## 2022-07-17 NOTE — Telephone Encounter (Signed)
Pt want to know if you can send his positive covid test results from last weeks visit  to his job email: [email or web address]

## 2022-07-17 NOTE — Telephone Encounter (Signed)
Patient aware results can not be faxed patient not able to find fax number to fax results over per patient he will try to send from his Mychart.

## 2022-07-17 NOTE — Telephone Encounter (Signed)
Called patient to inform that we are not able to email form results could be printed off and faxed or placed up front to be picked up. No answer LMTCB

## 2022-07-23 ENCOUNTER — Encounter: Payer: Self-pay | Admitting: Cardiovascular Disease

## 2022-07-23 ENCOUNTER — Other Ambulatory Visit (INDEPENDENT_AMBULATORY_CARE_PROVIDER_SITE_OTHER): Payer: BC Managed Care – PPO

## 2022-07-23 DIAGNOSIS — E039 Hypothyroidism, unspecified: Secondary | ICD-10-CM | POA: Diagnosis not present

## 2022-07-23 DIAGNOSIS — I10 Essential (primary) hypertension: Secondary | ICD-10-CM

## 2022-07-23 DIAGNOSIS — N1832 Chronic kidney disease, stage 3b: Secondary | ICD-10-CM

## 2022-07-23 DIAGNOSIS — E785 Hyperlipidemia, unspecified: Secondary | ICD-10-CM

## 2022-07-23 LAB — COMPREHENSIVE METABOLIC PANEL
ALT: 36 U/L (ref 0–53)
AST: 22 U/L (ref 0–37)
Albumin: 4.1 g/dL (ref 3.5–5.2)
Alkaline Phosphatase: 41 U/L (ref 39–117)
BUN: 33 mg/dL — ABNORMAL HIGH (ref 6–23)
CO2: 28 mEq/L (ref 19–32)
Calcium: 8.9 mg/dL (ref 8.4–10.5)
Chloride: 102 mEq/L (ref 96–112)
Creatinine, Ser: 1.72 mg/dL — ABNORMAL HIGH (ref 0.40–1.50)
GFR: 41.39 mL/min — ABNORMAL LOW (ref >60.00)
Glucose, Bld: 89 mg/dL (ref 70–99)
Potassium: 4 mEq/L (ref 3.5–5.1)
Sodium: 140 mEq/L (ref 135–145)
Total Bilirubin: 0.8 mg/dL (ref 0.2–1.2)
Total Protein: 7 g/dL (ref 6.0–8.3)

## 2022-07-23 LAB — LIPID PANEL
Cholesterol: 99 mg/dL (ref 0–200)
HDL: 30.6 mg/dL — ABNORMAL LOW (ref >39.00)
LDL Cholesterol: 52 mg/dL (ref 0–99)
NonHDL: 68.13
Total CHOL/HDL Ratio: 3
Triglycerides: 82 mg/dL (ref 0.0–149.0)
VLDL: 16.4 mg/dL (ref 0.0–40.0)

## 2022-07-23 LAB — URINALYSIS, ROUTINE W REFLEX MICROSCOPIC
Bilirubin Urine: NEGATIVE
Hgb urine dipstick: NEGATIVE
Ketones, ur: NEGATIVE
Leukocytes,Ua: NEGATIVE
Nitrite: NEGATIVE
RBC / HPF: NONE SEEN (ref 0–?)
Specific Gravity, Urine: <=1.005 — AB (ref 1.000–1.030)
Total Protein, Urine: NEGATIVE
Urine Glucose: NEGATIVE
Urobilinogen, UA: 0.2 (ref 0.0–1.0)
pH: 6 (ref 5.0–8.0)

## 2022-07-23 LAB — CBC
HCT: 39.5 % (ref 39.0–52.0)
Hemoglobin: 13.3 g/dL (ref 13.0–17.0)
MCHC: 33.7 g/dL (ref 30.0–36.0)
MCV: 88.5 fl (ref 78.0–100.0)
Platelets: 276 10*3/uL (ref 150.0–400.0)
RBC: 4.46 Mil/uL (ref 4.22–5.81)
RDW: 14 % (ref 11.5–15.5)
WBC: 10.9 10*3/uL — ABNORMAL HIGH (ref 4.0–10.5)

## 2022-07-23 LAB — TSH: TSH: 3.25 u[IU]/mL (ref 0.35–5.50)

## 2022-07-29 NOTE — Progress Notes (Unsigned)
Cardiology Office Note:   Date:  07/29/2022  NAME:  Christian Baird    MRN: [id number] DOB:  October 15, 1955   PCP:  Mliss Sax, MD  Cardiologist:  Reatha Harps, MD  Electrophysiologist:  None   Referring MD: Mliss Sax   No chief complaint on file. ***  History of Present Illness:   Christian Baird is a 67 y.o. male with a hx of CAD, HTN, HLD, CKD IIIb who presents for follow-up.   Problem List 1. NSTEMI 07/29/2019 -DES to dRCA 2. HTN 3. HLD -T chol 99, HDL 30, LDL 52, TG 82 4. CKD IIIb  Past Medical History: Past Medical History:  Diagnosis Date   Coronary artery disease    GERD (gastroesophageal reflux disease)    Hypertension    Hypothyroidism     Past Surgical History: Past Surgical History:  Procedure Laterality Date   ABDOMINAL SURGERY     CORONARY STENT INTERVENTION N/A 07/29/2019   Procedure: CORONARY STENT INTERVENTION;  Surgeon: Swaziland, Peter M, MD;  Location: [facility] INVASIVE CV LAB;  Service: Cardiovascular;  Laterality: N/A;   FRACTURE SURGERY     LEFT HEART CATH AND CORONARY ANGIOGRAPHY N/A 07/29/2019   Procedure: LEFT HEART CATH AND CORONARY ANGIOGRAPHY;  Surgeon: Swaziland, Peter M, MD;  Location: [facility] INVASIVE CV LAB;  Service: Cardiovascular;  Laterality: N/A;    Current Medications: No outpatient medications have been marked as taking for the 07/30/22 encounter (Appointment) with O'Neal, Ronnald Ramp, MD.     Allergies:    Patient has no known allergies.   Social History: Social History   Socioeconomic History   Marital status: Married    Spouse name: Not on file   Number of children: Not on file   Years of education: Not on file   Highest education level: Not on file  Occupational History   Occupation: [employer]  Tobacco Use   Smoking status: Never   Smokeless tobacco: Never  Vaping Use   Vaping Use: Never used  Substance and Sexual Activity   Alcohol use: Never    Alcohol/week: 0.0 standard drinks of alcohol    Drug use: Never   Sexual activity: Yes  Other Topics Concern   Not on file  Social History Narrative   Not on file   Social Determinants of Health   Financial Resource Strain: Patient Declined (07/07/2022)   Overall Financial Resource Strain (CARDIA)    Difficulty of Paying Living Expenses: Patient declined  Food Insecurity: Patient Declined (07/07/2022)   Hunger Vital Sign    Worried About Running Out of Food in the Last Year: Patient declined    Ran Out of Food in the Last Year: Patient declined  Transportation Needs: Patient Declined (07/07/2022)   PRAPARE - Administrator, Civil Service (Medical): Patient declined    Lack of Transportation (Non-Medical): Patient declined  Physical Activity: Insufficiently Active (07/07/2022)   Exercise Vital Sign    Days of Exercise per Week: 1 day    Minutes of Exercise per Session: 60 min  Stress: No Stress Concern Present (07/07/2022)   [employer] of Occupational Health - Occupational Stress Questionnaire    Feeling of Stress : Only a little  Social Connections: Unknown (07/07/2022)   Social Connection and Isolation Panel [NHANES]    Frequency of Communication with Friends and Family: Patient declined    Frequency of Social Gatherings with Friends and Family: Patient declined    Attends Religious Services: Patient declined  Active Member of Clubs or Organizations: No    Attends Engineer, structural: Not on file    Marital Status: Married     Family History: The patient's ***family history includes Diabetes in his father; Hypertension in his mother.  ROS:   All other ROS reviewed and negative. Pertinent positives noted in the HPI.     EKGs/Labs/Other Studies Reviewed:   The following studies were personally reviewed by me today:  EKG:  EKG is *** ordered today.  The ekg ordered today demonstrates ***, and was personally reviewed by me.   Recent Labs: 07/23/2022: ALT 36; BUN 33; Creatinine, Ser 1.72;  Hemoglobin 13.3; Platelets 276.0; Potassium 4.0; Sodium 140; TSH 3.25   Recent Lipid Panel    Component Value Date/Time   CHOL 99 07/23/2022 0841   CHOL 111 10/08/2019 0843   TRIG 82.0 07/23/2022 0841   HDL 30.60 (L) 07/23/2022 0841   HDL 33 (L) 10/08/2019 0843   CHOLHDL 3 07/23/2022 0841   VLDL 16.4 07/23/2022 0841   LDLCALC 52 07/23/2022 0841   LDLCALC 65 10/08/2019 0843    Physical Exam:   VS:  There were no vitals taken for this visit.   Wt Readings from Last 3 Encounters:  07/09/22 178 lb (80.7 kg)  03/13/22 179 lb 6.4 oz (81.4 kg)  09/26/21 179 lb (81.2 kg)    General: Well nourished, well developed, in no acute distress Head: Atraumatic, normal size  Eyes: PEERLA, EOMI  Neck: Supple, no JVD Endocrine: No thryomegaly Cardiac: Normal S1, S2; RRR; no murmurs, rubs, or gallops Lungs: Clear to auscultation bilaterally, no wheezing, rhonchi or rales  Abd: Soft, nontender, no hepatomegaly  Ext: No edema, pulses 2+ Musculoskeletal: No deformities, BUE and BLE strength normal and equal Skin: Warm and dry, no rashes   Neuro: Alert and oriented to person, place, time, and situation, CNII-XII grossly intact, no focal deficits  Psych: Normal mood and affect   ASSESSMENT:   Christian Baird is a 67 y.o. male who presents for the following: No diagnosis found.  PLAN:   There are no diagnoses linked to this encounter.  {Are you ordering a CV Procedure (e.g. stress test, cath, DCCV, TEE, etc)?   Press F2        :[id number]}  Disposition: No follow-ups on file.  Medication Adjustments/Labs and Tests Ordered: Current medicines are reviewed at length with the patient today.  Concerns regarding medicines are outlined above.  No orders of the defined types were placed in this encounter.  No orders of the defined types were placed in this encounter.   There are no Patient Instructions on file for this visit.   Time Spent with Patient: I have spent a total of *** minutes with  patient reviewing hospital notes, telemetry, EKGs, labs and examining the patient as well as establishing an assessment and plan that was discussed with the patient.  > 50% of time was spent in direct patient care.  Signed, Lenna Gilford. Flora Lipps, MD, [facility]  [facility]  [address], [address] [place], [place] [address] [phone]  07/29/2022 7:53 PM

## 2022-07-30 ENCOUNTER — Ambulatory Visit: Payer: BC Managed Care – PPO | Attending: Cardiovascular Disease | Admitting: Cardiovascular Disease

## 2022-07-30 ENCOUNTER — Encounter: Payer: Self-pay | Admitting: Cardiovascular Disease

## 2022-07-30 VITALS — BP 168/70 | HR 85 | Ht 66.0 in | Wt 179.8 lb

## 2022-07-30 DIAGNOSIS — I10 Essential (primary) hypertension: Secondary | ICD-10-CM

## 2022-07-30 DIAGNOSIS — I2510 Atherosclerotic heart disease of native coronary artery without angina pectoris: Secondary | ICD-10-CM | POA: Diagnosis not present

## 2022-07-30 DIAGNOSIS — E782 Mixed hyperlipidemia: Secondary | ICD-10-CM

## 2022-07-30 NOTE — Patient Instructions (Signed)
Medication Instructions:  The current medical regimen is effective;  continue present plan and medications.  *If you need a refill on your cardiac medications before your next appointment, please call your pharmacy*   Follow-Up: At [facility], you and your health needs are our priority.  As part of our continuing mission to provide you with exceptional heart care, we have created designated Provider Care Teams.  These Care Teams include your primary Cardiologist (physician) and Advanced Practice Providers (APPs -  Physician Assistants and Nurse Practitioners) who all work together to provide you with the care you need, when you need it.  We recommend signing up for the patient portal called "MyChart".  Sign up information is provided on this After Visit Summary.  MyChart is used to connect with patients for Virtual Visits (Telemedicine).  Patients are able to view lab/test results, encounter notes, upcoming appointments, etc.  Non-urgent messages can be sent to your provider as well.   To learn more about what you can do with MyChart, go to [email or web address].    Your next appointment:   12 month(s)  Provider:   [place] T O'Neal, MD   

## 2022-09-03 ENCOUNTER — Other Ambulatory Visit: Payer: Self-pay | Admitting: Family Medicine

## 2022-09-03 DIAGNOSIS — I10 Essential (primary) hypertension: Secondary | ICD-10-CM

## 2022-09-17 ENCOUNTER — Ambulatory Visit: Payer: BC Managed Care – PPO | Admitting: Cardiovascular Disease

## 2022-10-04 ENCOUNTER — Other Ambulatory Visit: Payer: Self-pay | Admitting: Family Medicine

## 2022-10-04 ENCOUNTER — Other Ambulatory Visit: Payer: Self-pay | Admitting: Cardiovascular Disease

## 2022-10-04 DIAGNOSIS — I10 Essential (primary) hypertension: Secondary | ICD-10-CM

## 2022-11-05 ENCOUNTER — Other Ambulatory Visit: Payer: Self-pay | Admitting: Family Medicine

## 2022-11-05 DIAGNOSIS — N5201 Erectile dysfunction due to arterial insufficiency: Secondary | ICD-10-CM

## 2022-11-05 DIAGNOSIS — I10 Essential (primary) hypertension: Secondary | ICD-10-CM

## 2022-11-07 ENCOUNTER — Other Ambulatory Visit: Payer: Self-pay

## 2022-11-07 ENCOUNTER — Other Ambulatory Visit: Payer: Self-pay | Admitting: Family Medicine

## 2022-11-07 DIAGNOSIS — I10 Essential (primary) hypertension: Secondary | ICD-10-CM

## 2022-11-07 MED ORDER — CARVEDILOL 6.25 MG PO TABS: 6.2500 mg | ORAL_TABLET | Freq: Two times a day (BID) | ORAL | 0 refills | Status: DC

## 2022-11-21 ENCOUNTER — Other Ambulatory Visit: Payer: Self-pay | Admitting: Family Medicine

## 2022-11-21 DIAGNOSIS — I10 Essential (primary) hypertension: Secondary | ICD-10-CM

## 2022-11-23 ENCOUNTER — Ambulatory Visit: Payer: Medicare Other | Admitting: Family Medicine

## 2022-11-23 ENCOUNTER — Encounter: Payer: Self-pay | Admitting: Family Medicine

## 2022-11-23 VITALS — BP 132/76 | HR 59 | Temp 98.7°F | Ht 66.0 in | Wt 175.4 lb

## 2022-11-23 DIAGNOSIS — Z0001 Encounter for general adult medical examination with abnormal findings: Secondary | ICD-10-CM | POA: Diagnosis not present

## 2022-11-23 DIAGNOSIS — Z23 Encounter for immunization: Secondary | ICD-10-CM | POA: Insufficient documentation

## 2022-11-23 DIAGNOSIS — I10 Essential (primary) hypertension: Secondary | ICD-10-CM

## 2022-11-23 DIAGNOSIS — M7742 Metatarsalgia, left foot: Secondary | ICD-10-CM | POA: Diagnosis not present

## 2022-11-23 DIAGNOSIS — Z0000 Encounter for general adult medical examination without abnormal findings: Secondary | ICD-10-CM

## 2022-11-23 MED ORDER — CARVEDILOL 6.25 MG PO TABS
6.2500 mg | ORAL_TABLET | Freq: Two times a day (BID) | ORAL | 1 refills | Status: DC
Start: 2022-11-23 — End: 2023-05-30

## 2022-11-23 MED ORDER — HYDRALAZINE HCL 25 MG PO TABS: ORAL_TABLET | ORAL | 0 refills | Status: DC

## 2022-11-23 NOTE — Progress Notes (Signed)
Annual Wellness Visit     Patient: Christian Baird, Male    DOB: [birth year], 67 y.o.   MRN: [id number]  Subjective  Chief Complaint  Patient presents with   wellness exam    Christian Baird is a 67 y.o. male who presents today for his Annual Wellness Visit. He reports consuming a general diet. Gym/ health club routine includes walking on track . He generally feels well. He reports sleeping well. He does have additional problems to discuss today.   HPI he is having pain in the balls of his left foot.  He has tried new shoes with some relief.  He is walking with his wife for exercise.  Blood pressure at home has been in the 1 teens over 60s to 70s since he retired from his stressful job.       Medications: Outpatient Medications Prior to Visit  Medication Sig   aspirin 81 MG EC tablet Take 1 tablet (81 mg total) by mouth daily.   atorvastatin (LIPITOR) 80 MG tablet Take 1 tablet (80 mg total) by mouth daily.   chlorthalidone (HYGROTON) 25 MG tablet Take 1 tablet (25 mg total) by mouth daily.   doxepin (SINEQUAN) 10 MG capsule TAKE 1 CAPSULE BY MOUTH 1 TO 2 TIMES DAILY AS NEEDED   hydrOXYzine (ATARAX) 10 MG tablet TAKE ONE TABLET BY MOUTH THREE TIMES DAILY AS NEEDED   levothyroxine (SYNTHROID) 75 MCG tablet TAKE ONE TABLET BY MOUTH ONCE A DAY AT 0600   lisinopril (ZESTRIL) 40 MG tablet TAKE ONE TABLET BY MOUTH ONCE A DAY   pantoprazole (PROTONIX) 40 MG tablet Take 1 tablet (40 mg total) by mouth daily.   sildenafil (REVATIO) 20 MG tablet TAKE 3 TO 5 TABLETS BY MOUTH AS NEEDED 45 MINUTES PRIOR TO INTERCOURSE   [DISCONTINUED] carvedilol (COREG) 6.25 MG tablet Take 1 tablet (6.25 mg total) by mouth 2 (two) times daily with a meal.   [DISCONTINUED] hydrALAZINE (APRESOLINE) 25 MG tablet TAKE ONE TABLET BY MOUTH THREE TIMES DAILY   No facility-administered medications prior to visit.    No Known Allergies  Patient Care Team: Mliss Sax, MD as PCP - General (Family  Medicine) O'Neal, Ronnald Ramp, MD as PCP - Cardiology (Internal Medicine)  Review of Systems  Constitutional: Negative.   HENT: Negative.    Eyes:  Negative for blurred vision, discharge and redness.  Respiratory: Negative.    Cardiovascular: Negative.   Gastrointestinal:  Negative for abdominal pain.  Genitourinary: Negative.   Musculoskeletal:  Positive for joint pain. Negative for myalgias.  Skin:  Negative for rash.  Neurological:  Negative for tingling, loss of consciousness and weakness.  Endo/Heme/Allergies:  Negative for polydipsia.        Objective  BP 132/76   Pulse (!) 59   Temp 98.7 F (37.1 C)   Ht 5\' 6"  (1.676 m)   Wt 175 lb 6.4 oz (79.6 kg)   SpO2 98%   BMI 28.31 kg/m    Physical Exam Constitutional:      General: He is not in acute distress.    Appearance: Normal appearance. He is not ill-appearing, toxic-appearing or diaphoretic.  HENT:     Head: Normocephalic and atraumatic.     Right Ear: External ear normal.     Left Ear: External ear normal.  Eyes:     General: No scleral icterus.       Right eye: No discharge.        Left eye: No discharge.  Extraocular Movements: Extraocular movements intact.     Conjunctiva/sclera: Conjunctivae normal.  Pulmonary:     Effort: Pulmonary effort is normal. No respiratory distress.  Musculoskeletal:     Right foot: Normal range of motion and normal capillary refill. No prominent metatarsal heads or tenderness. Normal pulse.     Comments: Right foot is pes planus.  Negative squeeze test.  Mild tenderness to palpation of the the second and third metatarsal heads.  Skin:    General: Skin is warm and dry.  Neurological:     Mental Status: He is alert and oriented to person, place, and time.  Psychiatric:        Mood and Affect: Mood normal.        Behavior: Behavior normal.       Most recent functional status assessment:    11/23/2022    9:09 AM  In your present state of health, do you have any  difficulty performing the following activities:  Hearing? 0  Vision? 0  Difficulty concentrating or making decisions? 0  Walking or climbing stairs? 0  Dressing or bathing? 0  Doing errands, shopping? 0  Preparing Food and eating ? N  Using the Toilet? N  In the past six months, have you accidently leaked urine? N  Do you have problems with loss of bowel control? N  Managing your Medications? N  Managing your Finances? N  Housekeeping or managing your Housekeeping? N   Most recent fall risk assessment:    11/23/2022    2:02 PM  Fall Risk   Falls in the past year? 0  Number falls in past yr: 0  Injury with Fall? 0  Risk for fall due to : History of fall(s)  Follow up Falls evaluation completed    Most recent depression screenings:    07/09/2022    2:41 PM 03/13/2022    1:42 PM  PHQ 2/9 Scores  PHQ - 2 Score 0 0   Most recent cognitive screening:    11/23/2022    9:18 AM  6CIT Screen  What Year? 0 points  What month? 0 points  What time? 0 points  Count back from 20 0 points  Months in reverse 0 points  Repeat phrase 0 points  Total Score 0 points   Most recent Audit-C alcohol use screening    11/23/2022    9:16 AM  Alcohol Use Disorder Test (AUDIT)  1. How often do you have a drink containing alcohol? 0  2. How many drinks containing alcohol do you have on a typical day when you are drinking? 0  3. How often do you have six or more drinks on one occasion? 0  AUDIT-C Score 0   A score of 3 or more in women, and 4 or more in men indicates increased risk for alcohol abuse, EXCEPT if all of the points are from question 1   Vision/Hearing Screen: Vision Screening   Right eye Left eye Both eyes  Without correction 20/25 20/15 20/20   With correction         No results found for any visits on 11/23/22.    Assessment & Plan   Annual wellness visit done today including the all of the following: Reviewed patient's Family Medical History Reviewed and updated  list of patient's medical providers Assessment of cognitive impairment was done Assessed patient's functional ability Established a written schedule for health screening services Health Risk Assessent Completed and Reviewed  Exercise Activities and Dietary recommendations  Goals      Set My Weight Loss Goal     Follow Up Date 11/24/2023    - set weight loss goal     Why is this important?   Losing only 5 to 15 percent of your weight makes a big difference in your health.    Notes: wantsto loss 10 lbs        Immunization History  Administered Date(s) Administered   Influenza Split 01/12/2014, 11/19/2014, 11/08/2015   Influenza Whole 11/16/2021   Influenza,inj,Quad PF,6-35 Mos 11/19/2018   Influenza,inj,quad, With Preservative 01/12/2014   Influenza-Unspecified 01/12/2014, 11/19/2014, 11/08/2015, 12/05/2019, 11/21/2020   PFIZER(Purple Top)SARS-COV-2 Vaccination 05/30/2019, 06/20/2019, 01/31/2020   Tdap 08/04/2015    Health Maintenance  Topic Date Due   Medicare Annual Wellness (AWV)  Never done   Pneumonia Vaccine 32+ Years old (1 of 2 - PCV) Never done   INFLUENZA VACCINE  09/20/2022   Zoster Vaccines- Shingrix (1 of 2) 03/07/2023 (Originally 09/18/2007)   Colonoscopy  08/29/2023   DTaP/Tdap/Td (2 - Td or Tdap) 08/03/2025   Hepatitis C Screening  Completed   HIV Screening  Completed   HPV VACCINES  Aged Out   COVID-19 Vaccine  Discontinued     Discussed health benefits of physical activity, and encouraged him to engage in regular exercise appropriate for his age and condition.    Problem List Items Addressed This Visit       Cardiovascular and Mediastinum   Essential hypertension   Relevant Medications   carvedilol (COREG) 6.25 MG tablet   hydrALAZINE (APRESOLINE) 25 MG tablet     Other   Metatarsalgia of left foot   Wellness examination - Primary   Relevant Orders   EKG 12-Lead (Completed)    Return Schedule physical for end of November., for annual  physical.   EKG showed regular rate and rhythm with a pulse rate of 54.  There were no ST segment elevations.  Blood pressure has responded well to decrease stress from work.  Will have him taper hydralazine over 2 weeks but continue with carvedilol and lisinopril and chlorthalidone.  He will continue to monitor his pressures and let me know if they trend back upward.  Discussed sports medicine referral orthopedic group that referral for inserts for metatarsalgia.  He would like to try some Advil or Aleve first.  He will let me know.  Mliss Sax, MD

## 2022-12-11 ENCOUNTER — Other Ambulatory Visit: Payer: Self-pay | Admitting: Family Medicine

## 2022-12-11 DIAGNOSIS — K219 Gastro-esophageal reflux disease without esophagitis: Secondary | ICD-10-CM

## 2022-12-12 ENCOUNTER — Telehealth: Payer: Self-pay | Admitting: Family Medicine

## 2022-12-12 DIAGNOSIS — K219 Gastro-esophageal reflux disease without esophagitis: Secondary | ICD-10-CM

## 2022-12-12 MED ORDER — PANTOPRAZOLE SODIUM 40 MG PO TBEC: 40.0000 mg | DELAYED_RELEASE_TABLET | Freq: Every day | ORAL | 1 refills | Status: DC

## 2022-12-12 NOTE — Telephone Encounter (Signed)
Patient notified VIA phone that RX was sent to the pharmacy.  Dm/cma  

## 2022-12-12 NOTE — Telephone Encounter (Signed)
Prescription Request  12/12/2022  LOV: 11/23/2022  What is the name of the medication or equipment? pantoprazole (PROTONIX) 40 MG tablet [[id number]]   Have you contacted your pharmacy to request a refill? No   Which pharmacy would you like this sent to?   [id number] - [place], [place] - [address] [place] [address] Phone: [phone] Fax: [phone]   Patient notified that their request is being sent to the clinical staff for review and that they should receive a response within 2 business days.   Please advise at Mobile [phone] (mobile)

## 2023-01-04 ENCOUNTER — Telehealth: Payer: Self-pay | Admitting: Family Medicine

## 2023-01-04 NOTE — Telephone Encounter (Signed)
Got it I will call him.

## 2023-01-04 NOTE — Telephone Encounter (Signed)
Pt stated that labs had been ordered and he needed to make an appt I did not see the order. When in I will call him.

## 2023-01-07 ENCOUNTER — Ambulatory Visit (INDEPENDENT_AMBULATORY_CARE_PROVIDER_SITE_OTHER): Payer: Medicare Other | Admitting: Family Medicine

## 2023-01-07 ENCOUNTER — Encounter: Payer: Self-pay | Admitting: Family Medicine

## 2023-01-07 VITALS — BP 126/70 | HR 49 | Temp 97.8°F | Ht 66.0 in | Wt 176.8 lb

## 2023-01-07 DIAGNOSIS — Z0000 Encounter for general adult medical examination without abnormal findings: Secondary | ICD-10-CM | POA: Diagnosis not present

## 2023-01-07 DIAGNOSIS — E876 Hypokalemia: Secondary | ICD-10-CM

## 2023-01-07 DIAGNOSIS — E785 Hyperlipidemia, unspecified: Secondary | ICD-10-CM

## 2023-01-07 DIAGNOSIS — E739 Lactose intolerance, unspecified: Secondary | ICD-10-CM | POA: Diagnosis not present

## 2023-01-07 DIAGNOSIS — E039 Hypothyroidism, unspecified: Secondary | ICD-10-CM

## 2023-01-07 DIAGNOSIS — R3911 Hesitancy of micturition: Secondary | ICD-10-CM | POA: Diagnosis not present

## 2023-01-07 DIAGNOSIS — N401 Enlarged prostate with lower urinary tract symptoms: Secondary | ICD-10-CM | POA: Diagnosis not present

## 2023-01-07 DIAGNOSIS — E782 Mixed hyperlipidemia: Secondary | ICD-10-CM | POA: Diagnosis not present

## 2023-01-07 DIAGNOSIS — Z131 Encounter for screening for diabetes mellitus: Secondary | ICD-10-CM

## 2023-01-07 DIAGNOSIS — I10 Essential (primary) hypertension: Secondary | ICD-10-CM | POA: Diagnosis not present

## 2023-01-07 LAB — COMPREHENSIVE METABOLIC PANEL
ALT: 47 U/L (ref 0–53)
AST: 26 U/L (ref 0–37)
Albumin: 4.4 g/dL (ref 3.5–5.2)
Alkaline Phosphatase: 45 U/L (ref 39–117)
BUN: 28 mg/dL — ABNORMAL HIGH (ref 6–23)
CO2: 28 meq/L (ref 19–32)
Calcium: 8.7 mg/dL (ref 8.4–10.5)
Chloride: 101 meq/L (ref 96–112)
Creatinine, Ser: 1.8 mg/dL — ABNORMAL HIGH (ref 0.40–1.50)
GFR: 39.07 mL/min — ABNORMAL LOW (ref >60.00)
Glucose, Bld: 84 mg/dL (ref 70–99)
Potassium: 3.4 meq/L — ABNORMAL LOW (ref 3.5–5.1)
Sodium: 141 meq/L (ref 135–145)
Total Bilirubin: 1 mg/dL (ref 0.2–1.2)
Total Protein: 7.1 g/dL (ref 6.0–8.3)

## 2023-01-07 LAB — PSA: PSA: 2.31 ng/mL (ref 0.10–4.00)

## 2023-01-07 LAB — URINALYSIS, ROUTINE W REFLEX MICROSCOPIC
Bilirubin Urine: NEGATIVE
Hgb urine dipstick: NEGATIVE
Ketones, ur: NEGATIVE
Leukocytes,Ua: NEGATIVE
Nitrite: NEGATIVE
RBC / HPF: NONE SEEN (ref 0–?)
Specific Gravity, Urine: 1.01 (ref 1.000–1.030)
Total Protein, Urine: NEGATIVE
Urine Glucose: NEGATIVE
Urobilinogen, UA: 0.2 (ref 0.0–1.0)
WBC, UA: NONE SEEN (ref 0–?)
pH: 6 (ref 5.0–8.0)

## 2023-01-07 LAB — CBC
HCT: 41.5 % (ref 39.0–52.0)
Hemoglobin: 14.2 g/dL (ref 13.0–17.0)
MCHC: 34.2 g/dL (ref 30.0–36.0)
MCV: 88.8 fL (ref 78.0–100.0)
Platelets: 245 10*3/uL (ref 150.0–400.0)
RBC: 4.68 Mil/uL (ref 4.22–5.81)
RDW: 14.4 % (ref 11.5–15.5)
WBC: 9.6 10*3/uL (ref 4.0–10.5)

## 2023-01-07 LAB — LIPID PANEL
Cholesterol: 110 mg/dL (ref 0–200)
HDL: 30.9 mg/dL — ABNORMAL LOW (ref >39.00)
LDL Cholesterol: 66 mg/dL (ref 0–99)
NonHDL: 78.89
Total CHOL/HDL Ratio: 4
Triglycerides: 63 mg/dL (ref 0.0–149.0)
VLDL: 12.6 mg/dL (ref 0.0–40.0)

## 2023-01-07 LAB — TSH: TSH: 2.4 u[IU]/mL (ref 0.35–5.50)

## 2023-01-07 LAB — HEMOGLOBIN A1C: Hgb A1c MFr Bld: 5.7 % (ref 4.6–6.5)

## 2023-01-07 NOTE — Progress Notes (Signed)
Established Patient Office Visit   Subjective:  Patient ID: Christian Baird, male    DOB: 1955-08-27  Age: 67 y.o. MRN: [id number]  Chief Complaint  Patient presents with   Annual Exam    CPE. Pt is fasting   Diarrhea    Pt states he had diarrhea fro 10 days. He stopped drinking milk to see if that was the problem and has not had a issue since. Wants to know if there is a lactose test he can get here today.     Diarrhea  Pertinent negatives include no abdominal pain or myalgias.   Encounter Diagnoses  Name Primary?   Healthcare maintenance Yes   Acquired hypothyroidism    Hyperlipidemia, unspecified hyperlipidemia type    Mixed hyperlipidemia    Screening for diabetes mellitus    Lactose intolerance    Benign prostatic hyperplasia with urinary hesitancy    Essential hypertension    For physical today.  Successfully tapered hydralazine blood pressure remains under excellent control.  He is exercising at least 5 days weekly.  He is planning on seeing the dentist.  Status post episode of loose stools for 10 days.  He cut out dairy products in his diet and this seemed to help.  His stools fortunately have returned to normal.  He had had no fever blood or pus in his stool.  There was no appreciable abdominal pain.  He wonders about lactose intolerance.  Urine flow is okay but not what it used to be.   Review of Systems  Constitutional: Negative.   HENT: Negative.    Eyes:  Negative for blurred vision, discharge and redness.  Respiratory: Negative.    Cardiovascular: Negative.   Gastrointestinal:  Positive for diarrhea. Negative for abdominal pain.  Genitourinary: Negative.   Musculoskeletal: Negative.  Negative for myalgias.  Skin:  Negative for rash.  Neurological:  Negative for tingling, loss of consciousness and weakness.  Endo/Heme/Allergies:  Negative for polydipsia.     Current Outpatient Medications:    aspirin 81 MG EC tablet, Take 1 tablet (81 mg total) by mouth  daily., Disp: 90 tablet, Rfl: 3   atorvastatin (LIPITOR) 80 MG tablet, Take 1 tablet (80 mg total) by mouth daily., Disp: 90 tablet, Rfl: 0   carvedilol (COREG) 6.25 MG tablet, Take 1 tablet (6.25 mg total) by mouth 2 (two) times daily with a meal., Disp: 180 tablet, Rfl: 1   chlorthalidone (HYGROTON) 25 MG tablet, Take 1 tablet (25 mg total) by mouth daily., Disp: 90 tablet, Rfl: 1   doxepin (SINEQUAN) 10 MG capsule, TAKE 1 CAPSULE BY MOUTH 1 TO 2 TIMES DAILY AS NEEDED, Disp: 60 capsule, Rfl: 0   hydrOXYzine (ATARAX) 10 MG tablet, TAKE ONE TABLET BY MOUTH THREE TIMES DAILY AS NEEDED, Disp: 90 tablet, Rfl: 4   levothyroxine (SYNTHROID) 75 MCG tablet, TAKE ONE TABLET BY MOUTH ONCE A DAY AT 0600, Disp: 90 tablet, Rfl: 1   lisinopril (ZESTRIL) 40 MG tablet, TAKE ONE TABLET BY MOUTH ONCE A DAY, Disp: 90 tablet, Rfl: 0   pantoprazole (PROTONIX) 40 MG tablet, Take 1 tablet (40 mg total) by mouth daily., Disp: 90 tablet, Rfl: 1   sildenafil (REVATIO) 20 MG tablet, TAKE 3 TO 5 TABLETS BY MOUTH AS NEEDED 45 MINUTES PRIOR TO INTERCOURSE, Disp: 50 tablet, Rfl: 0   Objective:     BP 126/70   Pulse (!) 49   Temp 97.8 F (36.6 C)   Ht 5\' 6"  (1.676 m)  Wt 176 lb 12.8 oz (80.2 kg)   SpO2 99%   BMI 28.54 kg/m    Physical Exam Constitutional:      General: He is not in acute distress.    Appearance: Normal appearance. He is not ill-appearing, toxic-appearing or diaphoretic.  HENT:     Head: Normocephalic and atraumatic.     Right Ear: Tympanic membrane, ear canal and external ear normal.     Left Ear: Tympanic membrane, ear canal and external ear normal.     Mouth/Throat:     Mouth: Mucous membranes are moist.     Pharynx: Oropharynx is clear. No oropharyngeal exudate or posterior oropharyngeal erythema.  Eyes:     General: No scleral icterus.       Right eye: No discharge.        Left eye: No discharge.     Extraocular Movements: Extraocular movements intact.     Conjunctiva/sclera:  Conjunctivae normal.     Pupils: Pupils are equal, round, and reactive to light.  Cardiovascular:     Rate and Rhythm: Normal rate and regular rhythm.  Pulmonary:     Effort: Pulmonary effort is normal. No respiratory distress.     Breath sounds: Normal breath sounds.  Abdominal:     General: Bowel sounds are normal.     Tenderness: There is no abdominal tenderness. There is no guarding.  Musculoskeletal:     Cervical back: No rigidity or tenderness.  Skin:    General: Skin is warm and dry.  Neurological:     Mental Status: He is alert and oriented to person, place, and time.  Psychiatric:        Mood and Affect: Mood normal.        Behavior: Behavior normal.      No results found for any visits on 01/07/23.    The ASCVD Risk score (Arnett DK, et al., 2019) failed to calculate for the following reasons:   The patient has a prior MI or stroke diagnosis    Assessment & Plan:   Healthcare maintenance -     Urinalysis, Routine w reflex microscopic  Acquired hypothyroidism -     CBC -     TSH  Hyperlipidemia, unspecified hyperlipidemia type -     Comprehensive metabolic panel -     Lipid panel  Mixed hyperlipidemia -     Comprehensive metabolic panel -     Lipid panel  Screening for diabetes mellitus -     Hemoglobin A1c  Lactose intolerance -     Breath hydrogen test; Future  Benign prostatic hyperplasia with urinary hesitancy -     PSA  Essential hypertension -     CBC -     Comprehensive metabolic panel -     Urinalysis, Routine w reflex microscopic    Return in about 1 year (around 01/07/2024), or if symptoms worsen or fail to improve.  Information was given on health maintenance and disease prevention.  Information was given on lactose intolerance and testing for it.  He would like to have this done  Mliss Sax, MD

## 2023-01-08 DIAGNOSIS — E876 Hypokalemia: Secondary | ICD-10-CM | POA: Insufficient documentation

## 2023-01-08 MED ORDER — POTASSIUM CHLORIDE CRYS ER 20 MEQ PO TBCR: 20.0000 meq | EXTENDED_RELEASE_TABLET | Freq: Every day | ORAL | 2 refills | Status: DC

## 2023-01-08 NOTE — Addendum Note (Signed)
Addended by: Andrez Grime on: 01/08/2023 09:33 AM   Modules accepted: Orders

## 2023-01-21 ENCOUNTER — Encounter: Payer: Self-pay | Admitting: Family Medicine

## 2023-01-21 NOTE — Addendum Note (Signed)
Addended by: Andrez Grime on: 01/21/2023 03:50 PM   Modules accepted: Level of Service

## 2023-01-28 ENCOUNTER — Other Ambulatory Visit: Payer: Self-pay | Admitting: Family Medicine

## 2023-01-28 DIAGNOSIS — E039 Hypothyroidism, unspecified: Secondary | ICD-10-CM

## 2023-02-07 ENCOUNTER — Other Ambulatory Visit: Payer: Self-pay | Admitting: Family Medicine

## 2023-02-07 DIAGNOSIS — N5201 Erectile dysfunction due to arterial insufficiency: Secondary | ICD-10-CM

## 2023-02-18 ENCOUNTER — Other Ambulatory Visit: Payer: Self-pay | Admitting: Family Medicine

## 2023-02-18 DIAGNOSIS — I10 Essential (primary) hypertension: Secondary | ICD-10-CM

## 2023-02-27 ENCOUNTER — Other Ambulatory Visit: Payer: Self-pay | Admitting: Family Medicine

## 2023-02-27 DIAGNOSIS — I10 Essential (primary) hypertension: Secondary | ICD-10-CM

## 2023-03-18 ENCOUNTER — Ambulatory Visit: Payer: Medicare Other | Admitting: Family Medicine

## 2023-03-19 ENCOUNTER — Ambulatory Visit (INDEPENDENT_AMBULATORY_CARE_PROVIDER_SITE_OTHER): Payer: Medicare Other | Admitting: Family Medicine

## 2023-03-19 ENCOUNTER — Encounter: Payer: Self-pay | Admitting: Family Medicine

## 2023-03-19 VITALS — BP 142/8 | HR 62 | Temp 98.6°F | Ht 66.0 in | Wt 184.0 lb

## 2023-03-19 DIAGNOSIS — R300 Dysuria: Secondary | ICD-10-CM

## 2023-03-19 LAB — POC URINALSYSI DIPSTICK (AUTOMATED)
Bilirubin, UA: NEGATIVE
Blood, UA: POSITIVE
Glucose, UA: NEGATIVE
Ketones, UA: NEGATIVE
Nitrite, UA: NEGATIVE
Protein, UA: POSITIVE — AB
Spec Grav, UA: 1.015
Urobilinogen, UA: 0.2 U/dL
pH, UA: 6

## 2023-03-19 MED ORDER — SULFAMETHOXAZOLE-TRIMETHOPRIM 800-160 MG PO TABS: 1.0000 | ORAL_TABLET | Freq: Two times a day (BID) | ORAL | 0 refills | Status: DC

## 2023-03-19 NOTE — Progress Notes (Signed)
Established Patient Office Visit   Subjective:  Patient ID: Christian Baird, male    DOB: 23-Aug-1955  Age: 68 y.o. MRN: [id number]  Chief Complaint  Patient presents with   Burning with urination    Since Saturday, fever on Saturday and Sunday    HPI Encounter Diagnoses  Name Primary?   Burning with urination Yes   3-day history of dysuria with urgency.  There has been no frequency or discharge.  Not active sexually for 6 months.  Ongoing history of nocturia x 2 with some decrease in the force of the stream.  Urinalysis in November normal.  PSA was 2.31.   Review of Systems  Constitutional: Negative.  Negative for chills, fever and malaise/fatigue.  HENT: Negative.    Eyes:  Negative for blurred vision, discharge and redness.  Respiratory: Negative.    Cardiovascular: Negative.   Gastrointestinal:  Negative for abdominal pain.  Genitourinary:  Positive for dysuria and urgency. Negative for flank pain, frequency and hematuria.  Musculoskeletal: Negative.  Negative for myalgias.  Skin:  Negative for rash.  Neurological:  Negative for tingling, loss of consciousness and weakness.  Endo/Heme/Allergies:  Negative for polydipsia.     Current Outpatient Medications:    aspirin 81 MG EC tablet, Take 1 tablet (81 mg total) by mouth daily., Disp: 90 tablet, Rfl: 3   atorvastatin (LIPITOR) 80 MG tablet, Take 1 tablet (80 mg total) by mouth daily., Disp: 90 tablet, Rfl: 0   carvedilol (COREG) 6.25 MG tablet, Take 1 tablet (6.25 mg total) by mouth 2 (two) times daily with a meal., Disp: 180 tablet, Rfl: 1   chlorthalidone (HYGROTON) 25 MG tablet, TAKE ONE TABLET BY MOUTH ONCE A DAY, Disp: 90 tablet, Rfl: 0   doxepin (SINEQUAN) 10 MG capsule, TAKE 1 CAPSULE BY MOUTH 1 TO 2 TIMES DAILY AS NEEDED, Disp: 60 capsule, Rfl: 0   hydrOXYzine (ATARAX) 10 MG tablet, TAKE ONE TABLET BY MOUTH THREE TIMES DAILY AS NEEDED, Disp: 90 tablet, Rfl: 4   levothyroxine (SYNTHROID) 75 MCG tablet, TAKE ONE  TABLET BY MOUTH ONCE A DAY AT 6AM, Disp: 90 tablet, Rfl: 1   lisinopril (ZESTRIL) 40 MG tablet, TAKE ONE TABLET BY MOUTH ONCE A DAY, Disp: 90 tablet, Rfl: 0   pantoprazole (PROTONIX) 40 MG tablet, Take 1 tablet (40 mg total) by mouth daily., Disp: 90 tablet, Rfl: 1   potassium chloride SA (KLOR-CON M) 20 MEQ tablet, Take 1 tablet (20 mEq total) by mouth daily., Disp: 90 tablet, Rfl: 2   sildenafil (REVATIO) 20 MG tablet, TAKE THREE TO FIVE TABLETS BY MOUTH AS NEEDED 45 MINUTES PRIOR TO INTERCOURSE, Disp: 50 tablet, Rfl: 0   sulfamethoxazole-trimethoprim (BACTRIM DS) 800-160 MG tablet, Take 1 tablet by mouth 2 (two) times daily for 10 days., Disp: 20 tablet, Rfl: 0   Objective:     BP (!) 142/8 (BP Location: Right Arm, Patient Position: Sitting, Cuff Size: Normal)   Pulse 62   Temp 98.6 F (37 C)   Ht 5\' 6"  (1.676 m)   Wt 184 lb (83.5 kg)   SpO2 99%   BMI 29.70 kg/m    Physical Exam Constitutional:      General: He is not in acute distress.    Appearance: Normal appearance. He is not ill-appearing, toxic-appearing or diaphoretic.  HENT:     Head: Normocephalic and atraumatic.     Right Ear: External ear normal.     Left Ear: External ear normal.  Eyes:  General: No scleral icterus.       Right eye: No discharge.        Left eye: No discharge.     Extraocular Movements: Extraocular movements intact.     Conjunctiva/sclera: Conjunctivae normal.     Pupils: Pupils are equal, round, and reactive to light.  Cardiovascular:     Rate and Rhythm: Normal rate and regular rhythm.  Pulmonary:     Effort: Pulmonary effort is normal. No respiratory distress.     Breath sounds: Normal breath sounds. No wheezing, rhonchi or rales.  Abdominal:     General: Bowel sounds are normal.     Tenderness: There is no abdominal tenderness. There is no right CVA tenderness, left CVA tenderness, guarding or rebound.  Musculoskeletal:     Cervical back: No rigidity or tenderness.  Skin:     General: Skin is warm and dry.  Neurological:     Mental Status: He is alert and oriented to person, place, and time.  Psychiatric:        Mood and Affect: Mood normal.        Behavior: Behavior normal.      Results for orders placed or performed in visit on 03/19/23  POCT Urinalysis Dipstick (Automated)  Result Value Ref Range   Color, UA     Clarity, UA     Glucose, UA Negative Negative   Bilirubin, UA Negative    Ketones, UA Negative    Spec Grav, UA 1.015 1.010 - 1.025   Blood, UA positive    pH, UA 6.0 5.0 - 8.0   Protein, UA Positive (A) Negative   Urobilinogen, UA 0.2 0.2 or 1.0 E.U./dL   Nitrite, UA Negative    Leukocytes, UA Moderate (2+) (A) Negative      The ASCVD Risk score (Arnett DK, et al., 2019) failed to calculate for the following reasons:   Risk score cannot be calculated because patient has a medical history suggesting prior/existing ASCVD    Assessment & Plan:   Burning with urination -     POCT Urinalysis Dipstick (Automated) -     Urine Culture -     Sulfamethoxazole-Trimethoprim; Take 1 tablet by mouth 2 (two) times daily for 10 days.  Dispense: 20 tablet; Refill: 0    Return in about 1 month (around 04/19/2023), or if symptoms worsen or fail to improve.    Mliss Sax, MD

## 2023-03-20 ENCOUNTER — Encounter: Payer: Self-pay | Admitting: Family Medicine

## 2023-03-21 ENCOUNTER — Encounter: Payer: Self-pay | Admitting: Family Medicine

## 2023-03-21 LAB — URINE CULTURE
MICRO NUMBER:: 16008511
SPECIMEN QUALITY:: ADEQUATE

## 2023-03-21 MED ORDER — AMOXICILLIN 875 MG PO TABS: 875.0000 mg | ORAL_TABLET | Freq: Two times a day (BID) | ORAL | 0 refills | Status: AC

## 2023-03-21 NOTE — Addendum Note (Signed)
Addended by: Andrez Grime on: 03/21/2023 12:24 PM   Modules accepted: Orders

## 2023-03-22 ENCOUNTER — Telehealth: Payer: Self-pay | Admitting: Family Medicine

## 2023-03-22 NOTE — Telephone Encounter (Signed)
This may be concerning his lab results.

## 2023-03-22 NOTE — Telephone Encounter (Signed)
Copied from CRM [id number]. Topic: General - Call Back - No Documentation >> Mar 21, 2023  2:22 PM Alvino Blood C wrote: Reason for CRM: Patient is returning a call from [place] and would like for her to leave a VM because he is driving. Please call patient back at [phone]

## 2023-03-22 NOTE — Telephone Encounter (Signed)
I called and spoke with patient and notified him of lab results.

## 2023-05-15 ENCOUNTER — Other Ambulatory Visit: Payer: Self-pay | Admitting: Family Medicine

## 2023-05-15 DIAGNOSIS — I10 Essential (primary) hypertension: Secondary | ICD-10-CM

## 2023-05-17 ENCOUNTER — Encounter: Payer: Self-pay | Admitting: Family Medicine

## 2023-05-17 ENCOUNTER — Ambulatory Visit: Admitting: Family Medicine

## 2023-05-17 VITALS — BP 128/70 | HR 65 | Temp 99.8°F | Resp 18 | Wt 179.6 lb

## 2023-05-17 DIAGNOSIS — B349 Viral infection, unspecified: Secondary | ICD-10-CM

## 2023-05-17 DIAGNOSIS — N1832 Chronic kidney disease, stage 3b: Secondary | ICD-10-CM

## 2023-05-17 LAB — POCT INFLUENZA A/B
Influenza A, POC: NEGATIVE
Influenza B, POC: NEGATIVE

## 2023-05-17 LAB — POC COVID19 BINAXNOW: SARS Coronavirus 2 Ag: NEGATIVE

## 2023-05-17 MED ORDER — ACETAMINOPHEN 500 MG PO TABS: 500.0000 mg | ORAL_TABLET | Freq: Four times a day (QID) | ORAL | Status: DC | PRN

## 2023-05-17 NOTE — Patient Instructions (Signed)
 VISIT SUMMARY:  You came in today with chills, fever, and body aches that have been ongoing for the past two days. Your fever has fluctuated between 72F and 100F. You also reported mild tenderness in your lymph nodes but no other significant symptoms. Your COVID-19 and influenza tests were negative, and your symptoms are consistent with a viral infection. We also discussed your chronic kidney disease and the importance of avoiding certain medications.  YOUR PLAN:  -VIRAL INFECTION: A viral infection is caused by a virus and typically resolves on its own. Your symptoms are mild and should improve within a few days. You should use acetaminophen for fever and pain management, drink plenty of fluids, and get adequate rest. If you develop severe symptoms like persistent fever, chest pain, or shortness of breath, seek medical attention. If your symptoms last more than a week, please return for further evaluation.  -CHRONIC KIDNEY DISEASE (CKD): Chronic Kidney Disease is a condition where the kidneys are not functioning as well as they should. Your GFR is around 40, indicating mild kidney dysfunction. To protect your kidneys, avoid using NSAIDs like ibuprofen and naproxen. Instead, use acetaminophen for pain and fever management.  INSTRUCTIONS:  Please follow up if your symptoms persist beyond a week or if you develop severe symptoms such as persistent fever, chest pain, or shortness of breath. Continue to avoid NSAIDs and use acetaminophen as needed for pain and fever management.

## 2023-05-17 NOTE — Progress Notes (Signed)
 Assessment/Plan:    Assessment & Plan Viral infection Presents with chills, fever, body aches, and mild lymph node tenderness for two days. Fever fluctuates between 32F and 100F, without exceeding 101F. COVID-19 and influenza tests are negative. Symptoms are consistent with a viral infection, which is common and typically self-limiting. Reassured that symptoms are mild and likely due to a viral infection, not lupus or another serious condition. Viral infections usually resolve within 3-5 days, but can last up to 10 days. - Use acetaminophen for fever and pain management, avoiding NSAIDs due to CKD. - Ensure adequate fluid intake and rest. - Seek medical attention if severe symptoms develop, such as persistent fever, chest pain, or shortness of breath. - Return for evaluation if symptoms persist beyond a week.  Chronic Kidney Disease (CKD) Mild kidney dysfunction with a GFR of approximately 40, indicating CKD. Advised against the use of NSAIDs due to potential adverse effects on kidney function. Recommended acetaminophen as a safer alternative for pain and fever management. - Avoid NSAIDs such as ibuprofen and naproxen. - Use acetaminophen for pain and fever management.    There are no discontinued medications.  Return if symptoms worsen or fail to improve.    Subjective:   Encounter date: 05/17/2023  Christian Baird is a 68 y.o. male who has Metatarsal deformity; Metatarsalgia of left foot; Equinus deformity of foot, acquired; NSTEMI (non-ST elevated myocardial infarction) (HCC); Hyperlipidemia; Essential hypertension; Coronary artery disease due to lipid rich plaque; Hypothyroidism; Gastroesophageal reflux disease; Anxiety; Lateral epicondylitis of left elbow; Plantar fasciitis, bilateral; Healthcare maintenance; Elevated glucose; Hyperkalemia; Stage 3b chronic kidney disease (HCC); Upper respiratory tract infection; Erectile dysfunction due to arterial insufficiency; COVID-19;  Need for immunization against influenza; Benign prostatic hyperplasia with urinary hesitancy; Lactose intolerance; and Hypokalemia on their problem list..   He  has a past medical history of Coronary artery disease, GERD (gastroesophageal reflux disease), Hypertension, and Hypothyroidism.Marland Kitchen   He presents with chief complaint of cold like symptoms  (Pt C/O of fever, body aches and chills for 2 days. Pt took OTC medication for fever. Pt is due for vaccinations and colonoscopy ) .   Discussed the use of AI scribe software for clinical note transcription with the patient, who gave verbal consent to proceed.  History of Present Illness Christian Baird is a 68 year old male with mild kidney dysfunction who presents with chills, fever, and body aches.  He has been experiencing chills, fever, and body aches for the past two days. The fever began on Wednesday night with a temperature of 100F, decreased to 32F on Thursday, and returned to 100F on Friday morning. No cough, sneezing, vomiting, diarrhea, chest pain, shortness of breath, runny nose, or sore throat. He describes the body aches as pain in the lower back, buttocks, and legs. No urinary or bowel incontinence, weakness, or numbness. He had a urinary infection in December but reports no current urinary symptoms.  He notes soreness in the lymph node areas under his arms and in the groin, but no significant pain or tenderness upon palpation. No rashes, trouble swallowing, or sinus pain. He experiences mild headaches and neck aches.  He has been using ibuprofen for symptom relief but was advised to switch to acetaminophen due to his history of mild kidney dysfunction, with a GFR around 40, indicating chronic kidney disease (CKD).  He has a history of influenza A infection in January, which persisted into February, but has not been sick since mid-February until the current symptoms began.  He denies any recent exposure to sick  individuals.       Past Surgical History:  Procedure Laterality Date   ABDOMINAL SURGERY     CORONARY STENT INTERVENTION N/A 07/29/2019   Procedure: CORONARY STENT INTERVENTION;  Surgeon: Swaziland, Peter M, MD;  Location: [facility] INVASIVE CV LAB;  Service: Cardiovascular;  Laterality: N/A;   FRACTURE SURGERY     LEFT HEART CATH AND CORONARY ANGIOGRAPHY N/A 07/29/2019   Procedure: LEFT HEART CATH AND CORONARY ANGIOGRAPHY;  Surgeon: Swaziland, Peter M, MD;  Location: [facility] INVASIVE CV LAB;  Service: Cardiovascular;  Laterality: N/A;    Outpatient Medications Prior to Visit  Medication Sig Dispense Refill   aspirin 81 MG EC tablet Take 1 tablet (81 mg total) by mouth daily. 90 tablet 3   atorvastatin (LIPITOR) 80 MG tablet Take 1 tablet (80 mg total) by mouth daily. 90 tablet 0   carvedilol (COREG) 6.25 MG tablet Take 1 tablet (6.25 mg total) by mouth 2 (two) times daily with a meal. 180 tablet 1   chlorthalidone (HYGROTON) 25 MG tablet TAKE ONE TABLET BY MOUTH ONCE A DAY 90 tablet 0   doxepin (SINEQUAN) 10 MG capsule TAKE 1 CAPSULE BY MOUTH 1 TO 2 TIMES DAILY AS NEEDED 60 capsule 0   hydrOXYzine (ATARAX) 10 MG tablet TAKE ONE TABLET BY MOUTH THREE TIMES DAILY AS NEEDED 90 tablet 4   levothyroxine (SYNTHROID) 75 MCG tablet TAKE ONE TABLET BY MOUTH ONCE A DAY AT 6AM 90 tablet 1   lisinopril (ZESTRIL) 40 MG tablet TAKE ONE TABLET BY MOUTH ONCE A DAY 90 tablet 0   pantoprazole (PROTONIX) 40 MG tablet Take 1 tablet (40 mg total) by mouth daily. 90 tablet 1   potassium chloride SA (KLOR-CON M) 20 MEQ tablet Take 1 tablet (20 mEq total) by mouth daily. 90 tablet 2   sildenafil (REVATIO) 20 MG tablet TAKE THREE TO FIVE TABLETS BY MOUTH AS NEEDED 45 MINUTES PRIOR TO INTERCOURSE 50 tablet 0   No facility-administered medications prior to visit.    Family History  Problem Relation Age of Onset   Hypertension Mother    Diabetes Father     Social History   Socioeconomic History   Marital status: Married     Spouse name: Not on file   Number of children: Not on file   Years of education: Not on file   Highest education level: 12th grade  Occupational History   Occupation: Programme researcher, broadcasting/film/video  Tobacco Use   Smoking status: Never   Smokeless tobacco: Never  Vaping Use   Vaping status: Never Used  Substance and Sexual Activity   Alcohol use: Never    Alcohol/week: 0.0 standard drinks of alcohol   Drug use: Never   Sexual activity: Yes  Other Topics Concern   Not on file  Social History Narrative   Not on file   Social Drivers of Health   Financial Resource Strain: Low Risk  (01/06/2023)   Overall Financial Resource Strain (CARDIA)    Difficulty of Paying Living Expenses: Not very hard  Food Insecurity: No Food Insecurity (01/06/2023)   Hunger Vital Sign    Worried About Running Out of Food in the Last Year: Never true    Ran Out of Food in the Last Year: Never true  Transportation Needs: No Transportation Needs (01/06/2023)   PRAPARE - Administrator, Civil Service (Medical): No    Lack of Transportation (Non-Medical): No  Physical Activity: Insufficiently Active (01/06/2023)  Exercise Vital Sign    Days of Exercise per Week: 3 days    Minutes of Exercise per Session: 40 min  Stress: No Stress Concern Present (01/06/2023)   [employer] of Occupational Health - Occupational Stress Questionnaire    Feeling of Stress : Only a little  Social Connections: Unknown (01/06/2023)   Social Connection and Isolation Panel [NHANES]    Frequency of Communication with Friends and Family: Twice a week    Frequency of Social Gatherings with Friends and Family: Once a week    Attends Religious Services: Patient declined    Database administrator or Organizations: No    Attends Banker Meetings: Never    Marital Status: Married  Recent Concern: Social Connections - Moderately Isolated (11/23/2022)   Social Connection and Isolation Panel [NHANES]    Frequency  of Communication with Friends and Family: Three times a week    Frequency of Social Gatherings with Friends and Family: More than three times a week    Attends Religious Services: Never    Database administrator or Organizations: No    Attends Banker Meetings: Never    Marital Status: Married  Catering manager Violence: Not At Risk (11/23/2022)   Humiliation, Afraid, Rape, and Kick questionnaire    Fear of Current or Ex-Partner: No    Emotionally Abused: No    Physically Abused: No    Sexually Abused: No                                                                                                  Objective:  Physical Exam: BP 128/70 (BP Location: Left Arm, Patient Position: Sitting, Cuff Size: Large)   Pulse 65   Temp 99.8 F (37.7 C) (Oral)   Resp 18   Wt 179 lb 9.6 oz (81.5 kg)   SpO2 99%   BMI 28.99 kg/m    Physical Exam VITALS: T- 99.8, P- 65, BP- 128/70, RR- 18, SaO2- 99% GENERAL: Alert, cooperative, well developed, no acute distress. HEENT: Normocephalic, normal oropharynx, moist mucous membranes, throat normal, no sores or ulcers. NECK: No cervical lymphadenopathy, non-tender, no lymphadenopathy in neck, axillary, or clavicular regions. CHEST: Clear to auscultation bilaterally, no wheezes, rhonchi, or crackles, non-labored breathing. CARDIOVASCULAR: Normal heart rate and rhythm, S1 and S2 normal without murmurs. ABDOMEN: Soft, non-tender, non-distended, without organomegaly, normal bowel sounds. EXTREMITIES: No cyanosis or edema. NEUROLOGICAL: Cranial nerves grossly intact, moves all extremities without gross motor or sensory deficit.    No results found.  Recent Results (from the past 2160 hours)  POCT Urinalysis Dipstick (Automated)     Status: Abnormal   Collection Time: 03/19/23  3:49 PM  Result Value Ref Range   Color, UA     Clarity, UA     Glucose, UA Negative Negative   Bilirubin, UA Negative    Ketones, UA Negative    Spec Grav, UA  1.015 1.010 - 1.025   Blood, UA positive     Comment: 25Ery/uL   pH, UA 6.0 5.0 - 8.0   Protein, UA Positive (A)  Negative    Comment: 30mg /dL   Urobilinogen, UA 0.2 0.2 or 1.0 E.U./dL   Nitrite, UA Negative    Leukocytes, UA Moderate (2+) (A) Negative    Comment: 125Leu/uL  Urine Culture     Status: Abnormal   Collection Time: 03/19/23  4:06 PM   Specimen: Urine  Result Value Ref Range   MICRO NUMBER: [id number]    SPECIMEN QUALITY: Adequate    Sample Source URINE    STATUS: FINAL    ISOLATE 1: Streptococcus agalactiae (A)     Comment: Greater than 100,000 CFU/mL of Group B Streptococcus isolated Beta-hemolytic streptococci are predictably susceptible to Penicillin and other beta-lactams. Susceptibility testing not routinely performed. Please contact the laboratory within 3 days if  susceptibility testing is desired.   POC COVID-19 [facility]     Status: None   Collection Time: 05/17/23  1:41 PM  Result Value Ref Range   SARS Coronavirus 2 Ag Negative Negative  POCT Influenza A/B     Status: None   Collection Time: 05/17/23  1:41 PM  Result Value Ref Range   Influenza A, POC Negative Negative   Influenza B, POC Negative Negative        Garner Nash, MD, MS.[email or web address]

## 2023-05-28 ENCOUNTER — Other Ambulatory Visit: Payer: Self-pay | Admitting: Family Medicine

## 2023-05-28 DIAGNOSIS — I10 Essential (primary) hypertension: Secondary | ICD-10-CM

## 2023-05-29 ENCOUNTER — Other Ambulatory Visit: Payer: Self-pay | Admitting: Family Medicine

## 2023-05-29 DIAGNOSIS — I10 Essential (primary) hypertension: Secondary | ICD-10-CM

## 2023-05-31 ENCOUNTER — Ambulatory Visit: Admitting: Family Medicine

## 2023-06-11 ENCOUNTER — Other Ambulatory Visit: Payer: Self-pay | Admitting: Family Medicine

## 2023-06-11 DIAGNOSIS — K219 Gastro-esophageal reflux disease without esophagitis: Secondary | ICD-10-CM

## 2023-07-06 ENCOUNTER — Other Ambulatory Visit: Payer: Self-pay | Admitting: Family Medicine

## 2023-07-06 DIAGNOSIS — N5201 Erectile dysfunction due to arterial insufficiency: Secondary | ICD-10-CM

## 2023-07-27 ENCOUNTER — Other Ambulatory Visit: Payer: Self-pay | Admitting: Family Medicine

## 2023-07-27 DIAGNOSIS — E039 Hypothyroidism, unspecified: Secondary | ICD-10-CM

## 2023-07-27 DIAGNOSIS — E785 Hyperlipidemia, unspecified: Secondary | ICD-10-CM

## 2023-07-29 ENCOUNTER — Encounter: Payer: Self-pay | Admitting: Cardiovascular Disease

## 2023-07-29 NOTE — Telephone Encounter (Signed)
Pt following up with you.

## 2023-08-14 ENCOUNTER — Other Ambulatory Visit: Payer: Self-pay | Admitting: Family Medicine

## 2023-08-14 ENCOUNTER — Other Ambulatory Visit: Payer: Self-pay

## 2023-08-14 DIAGNOSIS — I10 Essential (primary) hypertension: Secondary | ICD-10-CM

## 2023-08-14 MED ORDER — LISINOPRIL 40 MG PO TABS
40.0000 mg | ORAL_TABLET | Freq: Every day | ORAL | 0 refills | Status: DC
Start: 2023-08-14 — End: 2023-11-15

## 2023-08-24 ENCOUNTER — Other Ambulatory Visit: Payer: Self-pay | Admitting: Family Medicine

## 2023-08-24 DIAGNOSIS — I10 Essential (primary) hypertension: Secondary | ICD-10-CM

## 2023-09-08 ENCOUNTER — Other Ambulatory Visit: Payer: Self-pay | Admitting: Family Medicine

## 2023-09-08 DIAGNOSIS — K219 Gastro-esophageal reflux disease without esophagitis: Secondary | ICD-10-CM

## 2023-11-11 ENCOUNTER — Encounter: Payer: Self-pay | Admitting: Cardiovascular Disease

## 2023-11-13 ENCOUNTER — Encounter: Payer: Self-pay | Admitting: Cardiovascular Disease

## 2023-11-15 ENCOUNTER — Other Ambulatory Visit: Payer: Self-pay | Admitting: Family Medicine

## 2023-11-15 DIAGNOSIS — I10 Essential (primary) hypertension: Secondary | ICD-10-CM

## 2023-11-19 ENCOUNTER — Other Ambulatory Visit: Payer: Self-pay | Admitting: Family Medicine

## 2023-11-19 DIAGNOSIS — N5201 Erectile dysfunction due to arterial insufficiency: Secondary | ICD-10-CM

## 2023-11-26 ENCOUNTER — Other Ambulatory Visit: Payer: Self-pay | Admitting: Family Medicine

## 2023-11-26 DIAGNOSIS — I10 Essential (primary) hypertension: Secondary | ICD-10-CM

## 2023-12-03 ENCOUNTER — Ambulatory Visit (INDEPENDENT_AMBULATORY_CARE_PROVIDER_SITE_OTHER): Admitting: Family Medicine

## 2023-12-03 ENCOUNTER — Encounter: Payer: Self-pay | Admitting: Family Medicine

## 2023-12-03 VITALS — BP 130/74 | HR 62 | Temp 96.9°F | Ht 66.0 in | Wt 184.4 lb

## 2023-12-03 DIAGNOSIS — I10 Essential (primary) hypertension: Secondary | ICD-10-CM | POA: Diagnosis not present

## 2023-12-03 DIAGNOSIS — R351 Nocturia: Secondary | ICD-10-CM

## 2023-12-03 DIAGNOSIS — E785 Hyperlipidemia, unspecified: Secondary | ICD-10-CM

## 2023-12-03 DIAGNOSIS — N5201 Erectile dysfunction due to arterial insufficiency: Secondary | ICD-10-CM

## 2023-12-03 DIAGNOSIS — E663 Overweight: Secondary | ICD-10-CM

## 2023-12-03 DIAGNOSIS — K219 Gastro-esophageal reflux disease without esophagitis: Secondary | ICD-10-CM | POA: Diagnosis not present

## 2023-12-03 DIAGNOSIS — R7303 Prediabetes: Secondary | ICD-10-CM

## 2023-12-03 DIAGNOSIS — E039 Hypothyroidism, unspecified: Secondary | ICD-10-CM

## 2023-12-03 DIAGNOSIS — R6882 Decreased libido: Secondary | ICD-10-CM

## 2023-12-03 DIAGNOSIS — Z8739 Personal history of other diseases of the musculoskeletal system and connective tissue: Secondary | ICD-10-CM | POA: Diagnosis not present

## 2023-12-03 LAB — URINALYSIS, ROUTINE W REFLEX MICROSCOPIC
Bilirubin Urine: NEGATIVE
Hgb urine dipstick: NEGATIVE
Ketones, ur: NEGATIVE
Leukocytes,Ua: NEGATIVE
Nitrite: NEGATIVE
Specific Gravity, Urine: 1.015 (ref 1.000–1.030)
Total Protein, Urine: NEGATIVE
Urine Glucose: NEGATIVE
Urobilinogen, UA: 0.2 (ref 0.0–1.0)
pH: 5.5 (ref 5.0–8.0)

## 2023-12-03 LAB — COMPREHENSIVE METABOLIC PANEL WITH GFR
ALT: 33 U/L (ref 0–53)
AST: 22 U/L (ref 0–37)
Albumin: 4.5 g/dL (ref 3.5–5.2)
Alkaline Phosphatase: 42 U/L (ref 39–117)
BUN: 33 mg/dL — ABNORMAL HIGH (ref 6–23)
CO2: 29 meq/L (ref 19–32)
Calcium: 9.4 mg/dL (ref 8.4–10.5)
Chloride: 101 meq/L (ref 96–112)
Creatinine, Ser: 1.96 mg/dL — ABNORMAL HIGH (ref 0.40–1.50)
GFR: 35.05 mL/min — ABNORMAL LOW (ref >60.00)
Glucose, Bld: 78 mg/dL (ref 70–99)
Potassium: 3.9 meq/L (ref 3.5–5.1)
Sodium: 140 meq/L (ref 135–145)
Total Bilirubin: 0.9 mg/dL (ref 0.2–1.2)
Total Protein: 7.1 g/dL (ref 6.0–8.3)

## 2023-12-03 LAB — CBC
HCT: 39.9 % (ref 39.0–52.0)
Hemoglobin: 13.5 g/dL (ref 13.0–17.0)
MCHC: 33.8 g/dL (ref 30.0–36.0)
MCV: 88.8 fl (ref 78.0–100.0)
Platelets: 234 K/uL (ref 150.0–400.0)
RBC: 4.49 Mil/uL (ref 4.22–5.81)
RDW: 14 % (ref 11.5–15.5)
WBC: 8.4 K/uL (ref 4.0–10.5)

## 2023-12-03 LAB — LIPID PANEL
Cholesterol: 108 mg/dL (ref 0–200)
HDL: 33.3 mg/dL — ABNORMAL LOW (ref >39.00)
LDL Cholesterol: 57 mg/dL (ref 0–99)
NonHDL: 74.96
Total CHOL/HDL Ratio: 3
Triglycerides: 92 mg/dL (ref 0.0–149.0)
VLDL: 18.4 mg/dL (ref 0.0–40.0)

## 2023-12-03 LAB — URIC ACID: Uric Acid, Serum: 9 mg/dL — ABNORMAL HIGH (ref 4.0–7.8)

## 2023-12-03 LAB — TSH: TSH: 2.8 u[IU]/mL (ref 0.35–5.50)

## 2023-12-03 LAB — PSA: PSA: 2.71 ng/mL (ref 0.10–4.00)

## 2023-12-03 LAB — HEMOGLOBIN A1C: Hgb A1c MFr Bld: 5.7 % (ref 4.6–6.5)

## 2023-12-03 MED ORDER — SILDENAFIL CITRATE 20 MG PO TABS: ORAL_TABLET | ORAL | 0 refills | Status: AC

## 2023-12-03 MED ORDER — PANTOPRAZOLE SODIUM 40 MG PO TBEC: 40.0000 mg | DELAYED_RELEASE_TABLET | Freq: Every day | ORAL | 0 refills | Status: DC

## 2023-12-03 MED ORDER — CARVEDILOL 6.25 MG PO TABS: 6.2500 mg | ORAL_TABLET | Freq: Two times a day (BID) | ORAL | 0 refills | Status: DC

## 2023-12-03 MED ORDER — ATORVASTATIN CALCIUM 80 MG PO TABS: 80.0000 mg | ORAL_TABLET | Freq: Every day | ORAL | 3 refills | Status: AC

## 2023-12-03 NOTE — Progress Notes (Signed)
 Established Patient Office Visit   Subjective:  Patient ID: Christian Baird, male    DOB: Aug 19, 1955  Age: 68 y.o. MRN: [id number]  Chief Complaint  Patient presents with   Medical Management of Chronic Issues    Follow up. Pt is fasting.     HPI Encounter Diagnoses  Name Primary?   Essential hypertension Yes   Erectile dysfunction due to arterial insufficiency    Gastroesophageal reflux disease, unspecified whether esophagitis present    Hyperlipidemia, unspecified hyperlipidemia type    Acquired hypothyroidism    Nocturia    Libido, decreased    History of gout    Prediabetes    Overweight (BMI 25.0-29.9)    Follow-up of above.  Blood pressure remains controlled on current regimen of chlorthalidone , carvedilol  and lisinopril .  Continues high-dose atorvastatin  at 80 mg for history of hyperlipidemia with coronary artery disease.  Continues levothyroxine  75 mcg for hypothyroidism.  Feels as though energy levels are low.  He exercises 4 times weekly.  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  Libido waxes and wanes.   Review of Systems  Constitutional: Negative.  Negative for chills, diaphoresis, malaise/fatigue and weight loss.  HENT: Negative.    Eyes: Negative.  Negative for blurred vision, double vision, discharge and redness.  Respiratory: Negative.    Cardiovascular: Negative.  Negative for chest pain.  Gastrointestinal:  Negative for abdominal pain.  Genitourinary: Negative.   Musculoskeletal: Negative.  Negative for falls and myalgias.  Skin:  Negative for rash.  Neurological:  Negative for tingling, speech change, loss of consciousness and weakness.  Endo/Heme/Allergies:  Negative for polydipsia.  Psychiatric/Behavioral: Negative.       Current Outpatient Medications:    chlorthalidone  (HYGROTON ) 25 MG tablet, TAKE ONE TABLET BY MOUTH ONCE A DAY, Disp: 90 tablet, Rfl: 1   doxepin  (SINEQUAN ) 10 MG capsule, TAKE 1 CAPSULE BY MOUTH 1 TO 2 TIMES DAILY AS NEEDED,  Disp: 60 capsule, Rfl: 0   hydrOXYzine  (ATARAX ) 10 MG tablet, TAKE ONE TABLET BY MOUTH THREE TIMES DAILY AS NEEDED, Disp: 90 tablet, Rfl: 4   lisinopril  (ZESTRIL ) 40 MG tablet, Take 1 tablet (40 mg total) by mouth daily., Disp: 90 tablet, Rfl: 1   atorvastatin  (LIPITOR ) 80 MG tablet, Take 1 tablet (80 mg total) by mouth daily., Disp: 90 tablet, Rfl: 3   carvedilol  (COREG ) 6.25 MG tablet, Take 1 tablet (6.25 mg total) by mouth 2 (two) times daily with a meal., Disp: 180 tablet, Rfl: 0   levothyroxine  (SYNTHROID ) 75 MCG tablet, TAKE ONE TABLET BY MOUTH ONCE A DAY AT 6AM, Disp: 90 tablet, Rfl: 2   pantoprazole  (PROTONIX ) 40 MG tablet, Take 1 tablet (40 mg total) by mouth daily., Disp: 90 tablet, Rfl: 0   sildenafil  (REVATIO ) 20 MG tablet, TAKE THREE TO FIVE TABLETS BY MOUTH AS NEEDED 45 MINUTES PRIOR TO INTERCOURSE, Disp: 50 tablet, Rfl: 0   Objective:     BP 130/74 (BP Location: Right Arm, Patient Position: Sitting, Cuff Size: Normal)   Pulse 62   Temp (!) 96.9 F (36.1 C) (Temporal)   Ht 5' 6 (1.676 m)   Wt 184 lb 6.4 oz (83.6 kg)   SpO2 98%   BMI 29.76 kg/m  BP Readings from Last 3 Encounters:  12/03/23 130/74  05/17/23 128/70  03/19/23 (!) 142/8   Wt Readings from Last 3 Encounters:  12/03/23 184 lb 6.4 oz (83.6 kg)  05/17/23 179 lb 9.6 oz (81.5 kg)  03/19/23 184 lb (83.5 kg)  Physical Exam Constitutional:      General: He is not in acute distress.    Appearance: Normal appearance. He is not ill-appearing, toxic-appearing or diaphoretic.  HENT:     Head: Normocephalic and atraumatic.     Right Ear: External ear normal.     Left Ear: External ear normal.     Mouth/Throat:     Mouth: Mucous membranes are moist.     Pharynx: Oropharynx is clear. No oropharyngeal exudate or posterior oropharyngeal erythema.  Eyes:     General: No scleral icterus.       Right eye: No discharge.        Left eye: No discharge.     Extraocular Movements: Extraocular movements intact.      Conjunctiva/sclera: Conjunctivae normal.     Pupils: Pupils are equal, round, and reactive to light.  Cardiovascular:     Rate and Rhythm: Normal rate and regular rhythm.  Pulmonary:     Effort: Pulmonary effort is normal. No respiratory distress.     Breath sounds: Normal breath sounds.  Musculoskeletal:     Cervical back: No rigidity or tenderness.  Skin:    General: Skin is warm and dry.  Neurological:     Mental Status: He is alert and oriented to person, place, and time.  Psychiatric:        Mood and Affect: Mood normal.        Behavior: Behavior normal.      Results for orders placed or performed in visit on 12/03/23  Uric acid  Result Value Ref Range   Uric Acid, Serum 9.0 (H) 4.0 - 7.8 mg/dL  Lipid panel  Result Value Ref Range   Cholesterol 108 0 - 200 mg/dL   Triglycerides 07.9 0.0 - 149.0 mg/dL   HDL 66.69 (L) >60.99 mg/dL   VLDL 81.5 0.0 - 59.9 mg/dL   LDL Cholesterol 57 0 - 99 mg/dL   Total CHOL/HDL Ratio 3    NonHDL 74.96   Hemoglobin A1c  Result Value Ref Range   Hgb A1c MFr Bld 5.7 4.6 - 6.5 %  CBC  Result Value Ref Range   WBC 8.4 4.0 - 10.5 K/uL   RBC 4.49 4.22 - 5.81 Mil/uL   Platelets 234.0 150.0 - 400.0 K/uL   Hemoglobin 13.5 13.0 - 17.0 g/dL   HCT 60.0 60.9 - 47.9 %   MCV 88.8 78.0 - 100.0 fl   MCHC 33.8 30.0 - 36.0 g/dL   RDW 85.9 88.4 - 84.4 %  Comprehensive metabolic panel with GFR  Result Value Ref Range   Sodium 140 135 - 145 mEq/L   Potassium 3.9 3.5 - 5.1 mEq/L   Chloride 101 96 - 112 mEq/L   CO2 29 19 - 32 mEq/L   Glucose, Bld 78 70 - 99 mg/dL   BUN 33 (H) 6 - 23 mg/dL   Creatinine, Ser 8.03 (H) 0.40 - 1.50 mg/dL   Total Bilirubin 0.9 0.2 - 1.2 mg/dL   Alkaline Phosphatase 42 39 - 117 U/L   AST 22 0 - 37 U/L   ALT 33 0 - 53 U/L   Total Protein 7.1 6.0 - 8.3 g/dL   Albumin 4.5 3.5 - 5.2 g/dL   GFR 64.94 (L) >39.99 mL/min   Calcium  9.4 8.4 - 10.5 mg/dL  PSA  Result Value Ref Range   PSA 2.71 0.10 - 4.00 ng/mL  TSH  Result  Value Ref Range   TSH 2.80 0.35 - 5.50 uIU/mL  Urinalysis, Routine w reflex microscopic  Result Value Ref Range   Color, Urine YELLOW Yellow;Lt. Yellow;Straw;Dark Yellow;Amber;Green;Red;Brown   APPearance CLEAR Clear;Turbid;Slightly Cloudy;Cloudy   Specific Gravity, Urine 1.015 1.000 - 1.030   pH 5.5 5.0 - 8.0   Total Protein, Urine NEGATIVE Negative   Urine Glucose NEGATIVE Negative   Ketones, ur NEGATIVE Negative   Bilirubin Urine NEGATIVE Negative   Hgb urine dipstick NEGATIVE Negative   Urobilinogen, UA 0.2 0.0 - 1.0   Leukocytes,Ua NEGATIVE Negative   Nitrite NEGATIVE Negative   WBC, UA 0-2/hpf 0-2/hpf   RBC / HPF 0-2/hpf 0-2/hpf  Testosterone Total,Free,Bio, Males-([facility])  Result Value Ref Range   Testosterone 406 250 - 827 ng/dL   Albumin 4.5 3.6 - 5.1 g/dL   Sex Hormone Binding 30 22 - 77 nmol/L   Testosterone, Free 57.5 46.0 - 224.0 pg/mL   Testosterone, Bioavailable 118.3 110.0 - 575.0 ng/dL      The ASCVD Risk score (Arnett DK, et al., 2019) failed to calculate for the following reasons:   Risk score cannot be calculated because patient has a medical history suggesting prior/existing ASCVD    Assessment & Plan:   Essential hypertension -     Carvedilol ; Take 1 tablet (6.25 mg total) by mouth 2 (two) times daily with a meal.  Dispense: 180 tablet; Refill: 0 -     CBC -     Comprehensive metabolic panel with GFR -     Urinalysis, Routine w reflex microscopic  Erectile dysfunction due to arterial insufficiency -     Sildenafil  Citrate; TAKE THREE TO FIVE TABLETS BY MOUTH AS NEEDED 45 MINUTES PRIOR TO INTERCOURSE  Dispense: 50 tablet; Refill: 0  Gastroesophageal reflux disease, unspecified whether esophagitis present -     Pantoprazole  Sodium; Take 1 tablet (40 mg total) by mouth daily.  Dispense: 90 tablet; Refill: 0  Hyperlipidemia, unspecified hyperlipidemia type -     Atorvastatin  Calcium ; Take 1 tablet (80 mg total) by mouth daily.  Dispense: 90 tablet;  Refill: 3 -     Lipid panel -     Comprehensive metabolic panel with GFR  Acquired hypothyroidism -     TSH -     Levothyroxine  Sodium; TAKE ONE TABLET BY MOUTH ONCE A DAY AT 6AM  Dispense: 90 tablet; Refill: 2  Nocturia -     PSA -     Urinalysis, Routine w reflex microscopic  Libido, decreased -     Testosterone Total,Free,Bio, Males  History of gout -     Uric acid  Prediabetes -     Hemoglobin A1c -     Comprehensive metabolic panel with GFR  Overweight (BMI 25.0-29.9)    Return in about 3 months (around 03/04/2024).  Information was given on exercising to lose weight.  Elsie Sim Lent, MD

## 2023-12-04 LAB — TESTOSTERONE TOTAL,FREE,BIO, MALES
Albumin: 4.5 g/dL (ref 3.6–5.1)
Sex Hormone Binding: 30 nmol/L (ref 22–77)
Testosterone, Bioavailable: 118.3 ng/dL (ref 110.0–575.0)
Testosterone, Free: 57.5 pg/mL (ref 46.0–224.0)
Testosterone: 406 ng/dL (ref 250–827)

## 2023-12-05 ENCOUNTER — Ambulatory Visit: Payer: Self-pay | Admitting: Family Medicine

## 2023-12-05 MED ORDER — LEVOTHYROXINE SODIUM 75 MCG PO TABS: ORAL_TABLET | ORAL | 2 refills | Status: AC

## 2023-12-05 NOTE — Progress Notes (Signed)
 Please schedule patient to see me to discuss elevated uric acid, GFR and A1c.

## 2023-12-05 NOTE — Addendum Note (Signed)
 Addended by: BERNETA ELSIE LABOR on: 12/05/2023 08:13 AM   Modules accepted: Orders

## 2023-12-12 ENCOUNTER — Ambulatory Visit (INDEPENDENT_AMBULATORY_CARE_PROVIDER_SITE_OTHER): Admitting: Family Medicine

## 2023-12-12 ENCOUNTER — Encounter: Payer: Self-pay | Admitting: Family Medicine

## 2023-12-12 VITALS — BP 124/76 | HR 65 | Temp 97.6°F | Ht 66.0 in | Wt 182.2 lb

## 2023-12-12 DIAGNOSIS — N1832 Chronic kidney disease, stage 3b: Secondary | ICD-10-CM | POA: Diagnosis not present

## 2023-12-12 DIAGNOSIS — E790 Hyperuricemia without signs of inflammatory arthritis and tophaceous disease: Secondary | ICD-10-CM | POA: Diagnosis not present

## 2023-12-12 DIAGNOSIS — R7303 Prediabetes: Secondary | ICD-10-CM

## 2023-12-12 MED ORDER — EMPAGLIFLOZIN 10 MG PO TABS: 10.0000 mg | ORAL_TABLET | Freq: Every day | ORAL | 1 refills | Status: DC

## 2023-12-12 NOTE — Progress Notes (Signed)
 Established Patient Office Visit   Subjective:  Patient ID: Christian Baird, male    DOB: August 12, 1955  Age: 68 y.o. MRN: [id number]  Chief Complaint  Patient presents with   lab review    Folow up lab results. Pt has questions about his uric acid levels.     HPI Encounter Diagnoses  Name Primary?   CKD stage 3b, GFR 30-44 ml/min (HCC) Yes   Elevated uric acid in blood    Prediabetes    For follow-up of above.  Here to discuss gradual increase in creatinine likely associated with hypertension.  Blood pressure has been under better control since he retired and working at [employer] and in [employer].  Prediabetes with a recent A1c of 5.7.  Uric acid came back at 9.  A friend gave him a low purine diet.  He is mostly on a low purine diet.  He rarely if ever drinks alcohol.   Review of Systems  Constitutional: Negative.   HENT: Negative.    Eyes:  Negative for blurred vision, discharge and redness.  Respiratory: Negative.    Cardiovascular: Negative.   Gastrointestinal:  Negative for abdominal pain.  Genitourinary: Negative.   Musculoskeletal: Negative.  Negative for myalgias.  Skin:  Negative for rash.  Neurological:  Negative for tingling, loss of consciousness and weakness.  Endo/Heme/Allergies:  Negative for polydipsia.     Current Outpatient Medications:    atorvastatin  (LIPITOR ) 80 MG tablet, Take 1 tablet (80 mg total) by mouth daily., Disp: 90 tablet, Rfl: 3   carvedilol  (COREG ) 6.25 MG tablet, Take 1 tablet (6.25 mg total) by mouth 2 (two) times daily with a meal., Disp: 180 tablet, Rfl: 0   chlorthalidone  (HYGROTON ) 25 MG tablet, TAKE ONE TABLET BY MOUTH ONCE A DAY, Disp: 90 tablet, Rfl: 1   doxepin  (SINEQUAN ) 10 MG capsule, TAKE 1 CAPSULE BY MOUTH 1 TO 2 TIMES DAILY AS NEEDED, Disp: 60 capsule, Rfl: 0   empagliflozin (JARDIANCE) 10 MG TABS tablet, Take 1 tablet (10 mg total) by mouth daily., Disp: 90 tablet, Rfl: 1   hydrOXYzine  (ATARAX ) 10 MG tablet, TAKE  ONE TABLET BY MOUTH THREE TIMES DAILY AS NEEDED, Disp: 90 tablet, Rfl: 4   levothyroxine  (SYNTHROID ) 75 MCG tablet, TAKE ONE TABLET BY MOUTH ONCE A DAY AT 6AM, Disp: 90 tablet, Rfl: 2   lisinopril  (ZESTRIL ) 40 MG tablet, Take 1 tablet (40 mg total) by mouth daily., Disp: 90 tablet, Rfl: 1   pantoprazole  (PROTONIX ) 40 MG tablet, Take 1 tablet (40 mg total) by mouth daily., Disp: 90 tablet, Rfl: 0   sildenafil  (REVATIO ) 20 MG tablet, TAKE THREE TO FIVE TABLETS BY MOUTH AS NEEDED 45 MINUTES PRIOR TO INTERCOURSE, Disp: 50 tablet, Rfl: 0   Objective:     BP 124/76 (BP Location: Right Arm, Patient Position: Sitting, Cuff Size: Normal)   Pulse 65   Temp 97.6 F (36.4 C) (Temporal)   Ht 5' 6 (1.676 m)   Wt 182 lb 3.2 oz (82.6 kg)   SpO2 99%   BMI 29.41 kg/m    Physical Exam Constitutional:      General: He is not in acute distress.    Appearance: Normal appearance. He is not ill-appearing, toxic-appearing or diaphoretic.  HENT:     Head: Normocephalic and atraumatic.     Right Ear: External ear normal.     Left Ear: External ear normal.  Eyes:     General: No scleral icterus.  Right eye: No discharge.        Left eye: No discharge.     Extraocular Movements: Extraocular movements intact.     Conjunctiva/sclera: Conjunctivae normal.  Pulmonary:     Effort: Pulmonary effort is normal. No respiratory distress.  Skin:    General: Skin is warm and dry.  Neurological:     Mental Status: He is alert and oriented to person, place, and time.  Psychiatric:        Mood and Affect: Mood normal.        Behavior: Behavior normal.      No results found for any visits on 12/12/23.    The ASCVD Risk score (Arnett DK, et al., 2019) failed to calculate for the following reasons:   Risk score cannot be calculated because patient has a medical history suggesting prior/existing ASCVD    Assessment & Plan:   CKD stage 3b, GFR 30-44 ml/min (HCC) -     Multiple Myeloma Panel (SPEP&IFE  w/QIG) -     Ambulatory referral to Nephrology -     Empagliflozin; Take 1 tablet (10 mg total) by mouth daily.  Dispense: 90 tablet; Refill: 1  Elevated uric acid in blood  Prediabetes -     Empagliflozin; Take 1 tablet (10 mg total) by mouth daily.  Dispense: 90 tablet; Refill: 1    Return in about 3 months (around 03/13/2024).  Patient is on a low purine diet and has never had a gouty attack to his knowledge.  Will continue to follow uric acid levels.  Will start empagliflozin for CKD and prediabetes.  Referral to nephrology.  Information given on preventing type 2 diabetes.  Information was given on empagliflozin.  Information given on preventing chronic kidney disease.  Information given on a low purine diet.  Elsie Sim Lent, MD

## 2023-12-17 LAB — MULTIPLE MYELOMA PANEL, SERUM
Albumin SerPl Elph-Mcnc: 3.9 g/dL (ref 2.9–4.4)
Albumin/Glob SerPl: 1.4 (ref 0.7–1.7)
Alpha 1: 0.2 g/dL (ref 0.0–0.4)
Alpha2 Glob SerPl Elph-Mcnc: 0.7 g/dL (ref 0.4–1.0)
B-Globulin SerPl Elph-Mcnc: 1.1 g/dL (ref 0.7–1.3)
Gamma Glob SerPl Elph-Mcnc: 1 g/dL (ref 0.4–1.8)
Globulin, Total: 3 g/dL (ref 2.2–3.9)
IgA/Immunoglobulin A, Serum: 333 mg/dL (ref 61–437)
IgG (Immunoglobin G), Serum: 1108 mg/dL (ref 603–1613)
IgM (Immunoglobulin M), Srm: 83 mg/dL (ref 20–172)
Total Protein: 6.9 g/dL (ref 6.0–8.5)

## 2023-12-30 NOTE — Progress Notes (Unsigned)
 Cardiology Office Note:  .   Date:  01/01/2024  ID:  Christian Baird, DOB 1955/03/08, MRN [id number] PCP: Berneta Elsie Sayre, MD  [facility] Providers Cardiologist:  Darryle ONEIDA Decent, MD {   History of Present Illness: .    Chief Complaint  Patient presents with   Follow-up    [name] is a 68 y.o. male with history of CAD, HTN, HLD who presents for follow-up.    History of Present Illness   [name] is a 68 year old male with coronary artery disease who presents for a follow-up visit.  He has a history of coronary artery disease and experienced a non-ST elevation myocardial infarction in 2021, treated with a drug-eluting stent in the distal right coronary artery. He is here for a follow-up to discuss his current health status. No symptoms of angina, chest pain, or shortness of breath. He is physically active, walking up to three miles several days per week without issues.  His most recent LDL cholesterol level is 57, which is well-controlled. He is currently taking aspirin  81 mg daily and Lipitor  80 mg daily. His blood pressure today is slightly elevated at 140/82, but he reports it is usually between 120 to 130 at home. He continues to take lisinopril  40 mg daily, chlorthalidone  25 mg daily, and carvedilol  6.25 mg twice daily.  He mentions a concern about his kidney function and uric acid levels, which are being monitored by his primary care physician. He is awaiting a referral to a kidney specialist but has not yet been scheduled for an appointment. No symptoms of gout, although he experienced a foot problem a few months ago, which he does not believe was related to gout.  Socially, he retired from [employer] in August 2024 and is currently working part-time at [employer]. He tries to maintain a healthy diet, avoiding fast food and incorporating fruits and vegetables, although he finds it challenging to lose weight. He has a home in the [place] and  plans to move there eventually with his wife, who is still working full-time.          Problem List 1. NSTEMI 07/29/2019 -DES to dRCA 2. HTN 3. HLD -T chol 108, HDL 33, LDL 57, TG 92 4. CKD IIIb    ROS: All other ROS reviewed and negative. Pertinent positives noted in the HPI.     Studies Reviewed: SABRA   EKG Interpretation Date/Time:  Wednesday January 01 2024 08:17:20 EST Ventricular Rate:  53 PR Interval:  158 QRS Duration:  92 QT Interval:  432 QTC Calculation: 405 R Axis:   42  Text Interpretation: Sinus bradycardia Otherwise within normal limits Confirmed by Decent Darryle [phone]) on 01/01/2024 8:20:20 AM   TTE 07/29/2019  1. Left ventricular ejection fraction, by estimation, is 55 to 60%. The  left ventricle has normal function. The left ventricle has no regional  wall motion abnormalities. Left ventricular diastolic parameters were  normal.   2. Right ventricular systolic function is normal. The right ventricular  size is normal. Tricuspid regurgitation signal is inadequate for assessing  PA pressure.   3. Left atrial size was mildly dilated.   4. The mitral valve is normal in structure. Trivial mitral valve  regurgitation. No evidence of mitral stenosis.   5. The aortic valve is tricuspid. Aortic valve regurgitation is not  visualized. No aortic stenosis is present.   6. There is a linear echodensity in the ascending aorta. This may be  artifact,  but dissection cannot be excluded.. Aortic abnormal.   7. The inferior vena cava is normal in size with greater than 50%  respiratory variability, suggesting right atrial pressure of 3 mmHg.  Physical Exam:   VS:  BP (!) 140/82 (BP Location: Right Arm, Patient Position: Sitting, Cuff Size: Normal)   Pulse (!) 53   Ht 5' 6 (1.676 m)   Wt 179 lb (81.2 kg)   SpO2 98%   BMI 28.89 kg/m    Wt Readings from Last 3 Encounters:  01/01/24 179 lb (81.2 kg)  12/12/23 182 lb 3.2 oz (82.6 kg)  12/03/23 184 lb 6.4 oz (83.6 kg)     GEN: Well nourished, well developed in no acute distress NECK: No JVD; No carotid bruits CARDIAC: RRR, no murmurs, rubs, gallops RESPIRATORY:  Clear to auscultation without rales, wheezing or rhonchi  ABDOMEN: Soft, non-tender, non-distended EXTREMITIES:  No edema; No deformity  ASSESSMENT AND PLAN: .   Assessment and Plan    Atherosclerotic heart disease of native coronary artery, status post non-STEMI and drug-eluting stent, stable without angina Asymptomatic post-non-STEMI with stable stent. Risk factors controlled. No stress test needed unless symptomatic. - Continue aspirin  81 mg daily. - Continue Lipitor  80 mg daily. - Continue lisinopril  40 mg daily. - Continue chlorthalidone  25 mg daily. - Continue carvedilol  6.25 mg BID. - Monitor for symptoms such as chest discomfort or dyspnea and report if he occurs.  Chronic kidney disease stage 3b CKD stage 3b stable, managed by primary care. Awaiting nephrology consultation. - Continue follow-up with nephrology for CKD management.  Essential hypertension Hypertension controlled with current regimen, occasional elevation noted. - Continue lisinopril  40 mg daily. - Continue chlorthalidone  25 mg daily. - Continue carvedilol  6.25 mg BID.  Mixed hyperlipidemia LDL cholesterol at goal, lipid management effective. - Continue Lipitor  80 mg daily.              Follow-up: Return in about 1 year (around 12/31/2024).  Signed, Darryle DASEN. Barbaraann, MD, [facility]  [facility]  [address] [place], [place] [address] [phone]  8:38 AM

## 2024-01-01 ENCOUNTER — Ambulatory Visit: Attending: Student in an Organized Health Care Education/Training Program | Admitting: Cardiovascular Disease

## 2024-01-01 ENCOUNTER — Encounter: Payer: Self-pay | Admitting: Cardiovascular Disease

## 2024-01-01 VITALS — BP 140/82 | HR 53 | Ht 66.0 in | Wt 179.0 lb

## 2024-01-01 DIAGNOSIS — I2510 Atherosclerotic heart disease of native coronary artery without angina pectoris: Secondary | ICD-10-CM | POA: Insufficient documentation

## 2024-01-01 DIAGNOSIS — E782 Mixed hyperlipidemia: Secondary | ICD-10-CM | POA: Insufficient documentation

## 2024-01-01 DIAGNOSIS — I10 Essential (primary) hypertension: Secondary | ICD-10-CM | POA: Insufficient documentation

## 2024-01-01 NOTE — Patient Instructions (Addendum)
 Medication Instructions:  Continue same medications *If you need a refill on your cardiac medications before your next appointment, please call your pharmacy*  Lab Work: None ordered  Testing/Procedures: None ordered  Follow-Up: At [facility], you and your health needs are our priority.  As part of our continuing mission to provide you with exceptional heart care, our providers are all part of one team.  This team includes your primary Cardiologist (physician) and Advanced Practice Providers or APPs (Physician Assistants and Nurse Practitioners) who all work together to provide you with the care you need, when you need it.  Your next appointment:  1 year    Call in July to schedule Nov appointment     Provider:  PA    We recommend signing up for the patient portal called MyChart.  Sign up information is provided on this After Visit Summary.  MyChart is used to connect with patients for Virtual Visits (Telemedicine).  Patients are able to view lab/test results, encounter notes, upcoming appointments, etc.  Non-urgent messages can be sent to your provider as well.   To learn more about what you can do with MyChart, go to [email or web address]

## 2024-01-10 ENCOUNTER — Ambulatory Visit

## 2024-01-20 ENCOUNTER — Encounter: Payer: Self-pay | Admitting: Family Medicine

## 2024-01-20 DIAGNOSIS — N1832 Chronic kidney disease, stage 3b: Secondary | ICD-10-CM

## 2024-01-23 NOTE — Addendum Note (Signed)
 Addended by: BERNETA ELSIE LABOR on: 01/23/2024 07:54 AM   Modules accepted: Orders

## 2024-01-30 ENCOUNTER — Telehealth: Payer: Self-pay

## 2024-01-30 NOTE — Progress Notes (Unsigned)
 Complex Care Management Note Care Guide Note  01/30/2024 Name: Armari Fussell MRN: [id number] DOB: October 03, 1955   Complex Care Management Outreach Attempts: An unsuccessful telephone outreach was attempted today to offer the patient information about available complex care management services.  Follow Up Plan:  Additional outreach attempts will be made to offer the patient complex care management information and services.   Encounter Outcome:  No Answer  Dreama [facility]  [facility], [facility] VBCI Assistant Direct Dial: [phone]  Fax: [phone]

## 2024-01-31 NOTE — Progress Notes (Signed)
 Complex Care Management Note  Care Guide Note 01/31/2024 Name: Christian Baird MRN: [id number] DOB: [birth year]  Christian Baird is a 68 y.o. year old male who sees Berneta Elsie Sayre, MD for primary care. I reached out to Tanda Albee by phone today to offer complex care management services.  Christian Baird was given information about Complex Care Management services today including:   The Complex Care Management services include support from the care team which includes your Nurse Care Manager, Clinical Social Worker, or Pharmacist.  The Complex Care Management team is here to help remove barriers to the health concerns and goals most important to you. Complex Care Management services are voluntary, and the patient may decline or stop services at any time by request to their care team member.   Complex Care Management Consent Status: Patient agreed to services and verbal consent obtained.   Follow up plan:  Telephone appointment with complex care management team member scheduled for:  02/06/24 at 3:00 p.m.  Encounter Outcome:  Patient Scheduled  Dreama [facility]  [facility], [facility] VBCI Assistant Direct Dial: [phone]  Fax: [phone]

## 2024-02-06 ENCOUNTER — Other Ambulatory Visit: Payer: Self-pay

## 2024-02-06 ENCOUNTER — Telehealth: Payer: Self-pay

## 2024-02-06 DIAGNOSIS — N1832 Chronic kidney disease, stage 3b: Secondary | ICD-10-CM

## 2024-02-06 NOTE — Telephone Encounter (Signed)
 PAP: Patient assistance application for Farxiga through [employer] (AZ&Me) has been mailed to pt's home address on file. Provider portion of application will be faxed to provider's office.

## 2024-02-06 NOTE — Progress Notes (Signed)
 02/06/2024 Name: Christian Baird MRN: [id number] DOB: 07-23-1955  Chief Complaint  Patient presents with   Medication Assistance   Christian Baird is a 68 y.o. year old male who presented for a telephone visit.   They were referred to the pharmacist by their PCP for assistance in managing medication access.   Subjective:  Care Team: Primary Care Provider: Berneta Elsie Sayre, MD ; Next Scheduled Visit: 03/12/24  Medication Access/Adherence  Current Pharmacy:  [facility] [address], [place] - [address] [place] [address] Phone: [phone] Fax: [phone]  [facility] [address] [place] [address] Phone: [phone] Fax: [phone]  [id number] - [place], [place] - [address] [place] [address] Phone: [phone] Fax: [phone]  Patient reports affordability concerns with their medications: Yes  Patient reports access/transportation concerns to their pharmacy: No  Patient reports adherence concerns with their medications:  Yes    CKD Stage 3b: -Current medications:  recently prescribed Jardiance  10mg  daily, but this was going to cost patient around $800 -Most recent eGFR of 35 on 10/14 -Patient also diagnosed with pre-diabetes and ASCVD  Objective:  Lab Results  Component Value Date   HGBA1C 5.7 12/03/2023   Lab Results  Component Value Date   CREATININE 1.96 (H) 12/03/2023   BUN 33 (H) 12/03/2023   NA 140 12/03/2023   K 3.9 12/03/2023   CL 101 12/03/2023   CO2 29 12/03/2023   Lab Results  Component Value Date   CHOL 108 12/03/2023   HDL 33.30 (L) 12/03/2023   LDLCALC 57 12/03/2023   TRIG 92.0 12/03/2023   CHOLHDL 3 12/03/2023   Medications Reviewed Today     Reviewed by Deanna Christian LABOR, RPH (Pharmacist) on 02/06/24 at 1520  Med List Status: <None>   Medication Order Taking? Sig Documenting Provider Last Dose Status  Informant  atorvastatin  (LIPITOR ) 80 MG tablet [id number] Yes Take 1 tablet (80 mg total) by mouth daily. Berneta Elsie Sayre, MD  Active   carvedilol  (COREG ) 6.25 MG tablet [id number] Yes Take 1 tablet (6.25 mg total) by mouth 2 (two) times daily with a meal. Berneta Elsie Sayre, MD  Active   chlorthalidone  (HYGROTON ) 25 MG tablet [id number] Yes TAKE ONE TABLET BY MOUTH ONCE A DAY Berneta Elsie Sayre, MD  Active   dapagliflozin propanediol (FARXIGA) 5 MG TABS tablet [id number]  Take 5 mg by mouth daily. [provider]  Active   doxepin  (SINEQUAN ) 10 MG capsule [id number]  TAKE 1 CAPSULE BY MOUTH 1 TO 2 TIMES DAILY AS NEEDED Berneta Elsie Sayre, MD  Active     Discontinued 02/06/24 1520 (Change in therapy)   hydrOXYzine  (ATARAX ) 10 MG tablet [id number]  TAKE ONE TABLET BY MOUTH THREE TIMES DAILY AS NEEDED Barbaraann Darryle Ned, MD  Active   levothyroxine  (SYNTHROID ) 75 MCG tablet [id number] Yes TAKE ONE TABLET BY MOUTH ONCE A DAY AT 6AM Berneta Elsie Sayre, MD  Active   lisinopril  (ZESTRIL ) 40 MG tablet [id number] Yes Take 1 tablet (40 mg total) by mouth daily. Berneta Elsie Sayre, MD  Active   pantoprazole  (PROTONIX ) 40 MG tablet [id number] Yes Take 1 tablet (40 mg total) by mouth daily. Berneta Elsie Sayre, MD  Active   sildenafil  (REVATIO ) 20 MG tablet [id number]  TAKE THREE TO FIVE TABLETS BY MOUTH AS NEEDED 45 MINUTES PRIOR TO INTERCOURSE Berneta Elsie Sayre, MD  Active  Assessment/Plan:   CKD Stage 3b: -Does not qualify for LIS Medicare Extra Help or BI Cares PAP for Jardiance  based on HHI -Does not qualify for Health[employer] grant for Jardiance  (must have cardiomyopathy diagnosis) -Patient should qualify for AZ&Me PAP for Farxiga based on [facility].  I recommend changing Jardiance  10mg  daily to Farxiga 10mg  (recommended dose for CKD).  Patient is open to trying Farxiga instead; if Dr. Berneta in agreement, I will coordinate with medication assistance team to  initiate the application process.  Christian Baird, PharmD, DPLA

## 2024-02-11 NOTE — Telephone Encounter (Signed)
 Received Provider Portion PAP application for Farxiga (AZ&ME)

## 2024-02-19 ENCOUNTER — Other Ambulatory Visit (HOSPITAL_COMMUNITY): Payer: Self-pay

## 2024-02-19 NOTE — Telephone Encounter (Signed)
 PAP: Application for Christian Baird has been submitted to [employer] (AZ&Me), via fax

## 2024-02-21 NOTE — Telephone Encounter (Signed)
 PAP: Patient assistance application for Farxiga has been approved by PAP Companies: [employer] from 02/20/2024 to 02/18/2025. Medication should be delivered to PAP Delivery: Home. For further shipping updates, please contact [employer] (AZ[employer]) at [phone]. Patient ID is: [id number]

## 2024-02-27 ENCOUNTER — Other Ambulatory Visit: Payer: Self-pay | Admitting: Family Medicine

## 2024-02-27 DIAGNOSIS — I10 Essential (primary) hypertension: Secondary | ICD-10-CM

## 2024-02-28 ENCOUNTER — Encounter: Payer: Self-pay | Admitting: Family Medicine

## 2024-03-02 ENCOUNTER — Other Ambulatory Visit: Payer: Self-pay | Admitting: Family Medicine

## 2024-03-02 DIAGNOSIS — K219 Gastro-esophageal reflux disease without esophagitis: Secondary | ICD-10-CM

## 2024-03-10 ENCOUNTER — Other Ambulatory Visit: Payer: Self-pay | Admitting: Internal Medicine

## 2024-03-10 DIAGNOSIS — N1832 Chronic kidney disease, stage 3b: Secondary | ICD-10-CM

## 2024-03-12 ENCOUNTER — Ambulatory Visit: Admitting: Family Medicine

## 2024-03-24 ENCOUNTER — Ambulatory Visit
Admission: RE | Admit: 2024-03-24 | Discharge: 2024-03-24 | Disposition: A | Source: Ambulatory Visit | Attending: Internal Medicine | Admitting: Internal Medicine

## 2024-03-24 DIAGNOSIS — N1832 Chronic kidney disease, stage 3b: Secondary | ICD-10-CM

## 2024-04-16 ENCOUNTER — Ambulatory Visit: Admitting: Family Medicine
